# Patient Record
Sex: Female | Born: 1950 | Race: Black or African American | Hispanic: No | Smoking: Never smoker
Health system: Southern US, Community
[De-identification: ages and names within clinical notes are randomized; demographics above are authoritative.]

## PROBLEM LIST (undated history)

## (undated) DIAGNOSIS — E119 Type 2 diabetes mellitus without complications: Secondary | ICD-10-CM

## (undated) DIAGNOSIS — L309 Dermatitis, unspecified: Secondary | ICD-10-CM

## (undated) DIAGNOSIS — M549 Dorsalgia, unspecified: Secondary | ICD-10-CM

## (undated) DIAGNOSIS — F419 Anxiety disorder, unspecified: Secondary | ICD-10-CM

## (undated) DIAGNOSIS — E785 Hyperlipidemia, unspecified: Secondary | ICD-10-CM

## (undated) DIAGNOSIS — R531 Weakness: Secondary | ICD-10-CM

## (undated) DIAGNOSIS — R519 Headache, unspecified: Secondary | ICD-10-CM

## (undated) DIAGNOSIS — M1990 Unspecified osteoarthritis, unspecified site: Secondary | ICD-10-CM

## (undated) DIAGNOSIS — I10 Essential (primary) hypertension: Secondary | ICD-10-CM

## (undated) DIAGNOSIS — R51 Headache: Secondary | ICD-10-CM

## (undated) DIAGNOSIS — G8929 Other chronic pain: Secondary | ICD-10-CM

## (undated) HISTORY — PX: BACK SURGERY: SHX140

## (undated) HISTORY — PX: JOINT REPLACEMENT: SHX530

## (undated) HISTORY — PX: ABDOMINAL HYSTERECTOMY: SHX81

## (undated) HISTORY — PX: SPINAL FUSION: SHX223

---

## 2000-12-02 ENCOUNTER — Ambulatory Visit (HOSPITAL_COMMUNITY): Admission: RE | Admit: 2000-12-02 | Discharge: 2000-12-02 | Payer: Self-pay | Admitting: Obstetrics and Gynecology

## 2005-08-11 ENCOUNTER — Inpatient Hospital Stay: Admission: AD | Admit: 2005-08-11 | Discharge: 2005-08-14 | Payer: Self-pay | Admitting: Family Medicine

## 2005-08-12 ENCOUNTER — Ambulatory Visit: Payer: Self-pay | Admitting: Family Medicine

## 2007-03-31 ENCOUNTER — Encounter: Payer: Self-pay | Admitting: Family Medicine

## 2010-05-01 NOTE — Letter (Signed)
Summary: rma chart  rma chart   Imported By: Curtis Sites 11/15/2009 11:19:11  _____________________________________________________________________  External Attachment:    Type:   Image     Comment:   External Document

## 2014-05-15 ENCOUNTER — Other Ambulatory Visit: Payer: Self-pay | Admitting: Neurosurgery

## 2014-05-15 DIAGNOSIS — M5416 Radiculopathy, lumbar region: Secondary | ICD-10-CM

## 2014-05-25 ENCOUNTER — Ambulatory Visit
Admission: RE | Admit: 2014-05-25 | Discharge: 2014-05-25 | Disposition: A | Discharge status: Home / Self Care | Payer: Self-pay | Source: Ambulatory Visit | Attending: Neurosurgery | Admitting: Neurosurgery

## 2014-05-25 ENCOUNTER — Ambulatory Visit
Admission: RE | Admit: 2014-05-25 | Discharge: 2014-05-25 | Disposition: A | Discharge status: Home / Self Care | Payer: Worker's Compensation | Source: Ambulatory Visit | Attending: Neurosurgery | Admitting: Neurosurgery

## 2014-05-25 DIAGNOSIS — M5416 Radiculopathy, lumbar region: Secondary | ICD-10-CM

## 2014-05-25 MED ORDER — MEPERIDINE HCL 100 MG/ML IJ SOLN
50.0000 mg | Freq: Once | INTRAMUSCULAR | Status: AC
  Administered 2014-05-25: 50 mg via INTRAMUSCULAR

## 2014-05-25 MED ORDER — DIAZEPAM 5 MG PO TABS
10.0000 mg | ORAL_TABLET | Freq: Once | ORAL | Status: AC
  Administered 2014-05-25: 10 mg via ORAL

## 2014-05-25 MED ORDER — ONDANSETRON HCL 4 MG/2ML IJ SOLN
4.0000 mg | Freq: Once | INTRAMUSCULAR | Status: AC
  Administered 2014-05-25: 4 mg via INTRAMUSCULAR

## 2014-05-25 MED ORDER — IOHEXOL 180 MG/ML  SOLN
17.0000 mL | Freq: Once | INTRAMUSCULAR | Status: AC | PRN
  Administered 2014-05-25: 17 mL via INTRATHECAL

## 2014-05-25 NOTE — Discharge Instructions (Signed)
Myelogram Discharge Instructions  1. Go home and rest quietly for the next 24 hours.  It is important to lie flat for the next 24 hours.  Get up only to go to the restroom.  You may lie in the bed or on a couch on your back, your stomach, your left side or your right side.  You may have one pillow under your head.  You may have pillows between your knees while you are on your side or under your knees while you are on your back.  2. DO NOT drive today.  Recline the seat as far back as it will go, while still wearing your seat belt, on the way home.  3. You may get up to go to the bathroom as needed.  You may sit up for 10 minutes to eat.  You may resume your normal diet and medications unless otherwise indicated.  Drink plenty of extra fluids today and tomorrow.  4. The incidence of a spinal headache with nausea and/or vomiting is about 5% (one in 20 patients).  If you develop a headache, lie flat and drink plenty of fluids until the headache goes away.  Caffeinated beverages may be helpful.  If you develop severe nausea and vomiting or a headache that does not go away with flat bed rest, call [phone].  5. You may resume normal activities after your 24 hours of bed rest is over; however, do not exert yourself strongly or do any heavy lifting tomorrow.  6. Call your physician for a follow-up appointment.   You may resume Tramadol on Saturday, May 26, 2014 after 1:00pm.

## 2014-05-25 NOTE — Progress Notes (Signed)
Patient states she has been off Tramadol for at least the past two days.  jkl 

## 2015-04-30 ENCOUNTER — Other Ambulatory Visit: Payer: Self-pay | Admitting: Physician Assistant

## 2015-04-30 DIAGNOSIS — Z1231 Encounter for screening mammogram for malignant neoplasm of breast: Secondary | ICD-10-CM

## 2015-05-14 ENCOUNTER — Ambulatory Visit
Admission: RE | Admit: 2015-05-14 | Discharge: 2015-05-14 | Disposition: A | Discharge status: Home / Self Care | Payer: BLUE CROSS/BLUE SHIELD | Source: Ambulatory Visit | Attending: Physician Assistant | Admitting: Physician Assistant

## 2015-05-14 DIAGNOSIS — Z1231 Encounter for screening mammogram for malignant neoplasm of breast: Secondary | ICD-10-CM

## 2015-11-07 ENCOUNTER — Other Ambulatory Visit: Payer: Self-pay | Admitting: Neurological Surgery

## 2015-11-19 ENCOUNTER — Encounter (HOSPITAL_COMMUNITY)
Admission: RE | Admit: 2015-11-19 | Discharge: 2015-11-19 | Disposition: A | Discharge status: Home / Self Care | Payer: Worker's Compensation | Source: Ambulatory Visit | Attending: Neurological Surgery | Admitting: Neurological Surgery

## 2015-11-19 ENCOUNTER — Encounter (HOSPITAL_COMMUNITY): Payer: Self-pay

## 2015-11-19 ENCOUNTER — Other Ambulatory Visit: Payer: Self-pay

## 2015-11-19 DIAGNOSIS — Z01818 Encounter for other preprocedural examination: Secondary | ICD-10-CM | POA: Insufficient documentation

## 2015-11-19 DIAGNOSIS — Z96643 Presence of artificial hip joint, bilateral: Secondary | ICD-10-CM | POA: Insufficient documentation

## 2015-11-19 DIAGNOSIS — M4803 Spinal stenosis, cervicothoracic region: Secondary | ICD-10-CM | POA: Diagnosis not present

## 2015-11-19 DIAGNOSIS — G8929 Other chronic pain: Secondary | ICD-10-CM | POA: Diagnosis not present

## 2015-11-19 DIAGNOSIS — I10 Essential (primary) hypertension: Secondary | ICD-10-CM | POA: Diagnosis not present

## 2015-11-19 DIAGNOSIS — Z01812 Encounter for preprocedural laboratory examination: Secondary | ICD-10-CM | POA: Insufficient documentation

## 2015-11-19 DIAGNOSIS — E119 Type 2 diabetes mellitus without complications: Secondary | ICD-10-CM | POA: Insufficient documentation

## 2015-11-19 DIAGNOSIS — Z0183 Encounter for blood typing: Secondary | ICD-10-CM | POA: Insufficient documentation

## 2015-11-19 DIAGNOSIS — M5416 Radiculopathy, lumbar region: Secondary | ICD-10-CM | POA: Diagnosis not present

## 2015-11-19 DIAGNOSIS — Z79899 Other long term (current) drug therapy: Secondary | ICD-10-CM | POA: Insufficient documentation

## 2015-11-19 DIAGNOSIS — E785 Hyperlipidemia, unspecified: Secondary | ICD-10-CM | POA: Diagnosis not present

## 2015-11-19 DIAGNOSIS — M4316 Spondylolisthesis, lumbar region: Secondary | ICD-10-CM | POA: Insufficient documentation

## 2015-11-19 DIAGNOSIS — Z7984 Long term (current) use of oral hypoglycemic drugs: Secondary | ICD-10-CM | POA: Diagnosis not present

## 2015-11-19 HISTORY — DX: Headache: R51

## 2015-11-19 HISTORY — DX: Hyperlipidemia, unspecified: E78.5

## 2015-11-19 HISTORY — DX: Dermatitis, unspecified: L30.9

## 2015-11-19 HISTORY — DX: Essential (primary) hypertension: I10

## 2015-11-19 HISTORY — DX: Weakness: R53.1

## 2015-11-19 HISTORY — DX: Headache, unspecified: R51.9

## 2015-11-19 HISTORY — DX: Dorsalgia, unspecified: M54.9

## 2015-11-19 HISTORY — DX: Type 2 diabetes mellitus without complications: E11.9

## 2015-11-19 HISTORY — DX: Other chronic pain: G89.29

## 2015-11-19 LAB — BASIC METABOLIC PANEL
ANION GAP: 6 (ref 5–15)
BUN: 8 mg/dL (ref 6–20)
CALCIUM: 9.5 mg/dL (ref 8.9–10.3)
CO2: 27 mmol/L (ref 22–32)
CREATININE: 0.92 mg/dL (ref 0.44–1.00)
Chloride: 105 mmol/L (ref 101–111)
GFR calc Af Amer: >60 mL/min (ref >60)
GLUCOSE: 90 mg/dL (ref 65–99)
Potassium: 3.8 mmol/L (ref 3.5–5.1)
Sodium: 138 mmol/L (ref 135–145)

## 2015-11-19 LAB — CBC
HCT: 44 % (ref 36.0–46.0)
HEMOGLOBIN: 14.2 g/dL (ref 12.0–15.0)
MCH: 29.8 pg (ref 26.0–34.0)
MCHC: 32.3 g/dL (ref 30.0–36.0)
MCV: 92.2 fL (ref 78.0–100.0)
PLATELETS: 284 10*3/uL (ref 150–400)
RBC: 4.77 MIL/uL (ref 3.87–5.11)
RDW: 13.2 % (ref 11.5–15.5)
WBC: 5.1 10*3/uL (ref 4.0–10.5)

## 2015-11-19 LAB — GLUCOSE, CAPILLARY: Glucose-Capillary: 83 mg/dL (ref 65–99)

## 2015-11-19 LAB — SURGICAL PCR SCREEN
MRSA, PCR: NEGATIVE
STAPHYLOCOCCUS AUREUS: NEGATIVE

## 2015-11-19 LAB — ABO/RH: ABO/RH(D): A POS

## 2015-11-19 MED ORDER — CHLORHEXIDINE GLUCONATE CLOTH 2 % EX PADS: 6.0000 | MEDICATED_PAD | Freq: Once | CUTANEOUS | Status: DC

## 2015-11-19 NOTE — Progress Notes (Addendum)
Cardiologist saw a cardiologist a few yrs ago,is supposed to follow up yearly she states.Dr.Zakhary in [place]-to request any records  [facility] [place]enny [place]aucom [place]  Echo unsure  Stress test done 6+ yrs ago  Heart cath denies  EKG denies in past yr  CXR denies in past yr

## 2015-11-19 NOTE — Pre-Procedure Instructions (Signed)
Kathleen Bishop  11/19/2015      [facility] - [place], [place] - [address] [place] [address] Phone: [phone] Fax: [phone]  [facility] - [place], [place] - [id number] [place] [address] Phone: [phone] Fax: [phone]  [facility] - [place], [place] - [address] [place] [address] Phone: [phone] Fax: [phone]    Your procedure is scheduled on Tues, Aug 29  @ 7:30 AM  [facility] at 5:30 AM  Call this number if you have problems the morning of surgery:  [phone]   Remember:  Do not eat food or drink liquids after midnight.             Stop taking your Ibuprofen. No Goody's,BC's,Aleve,Aspirin,Advil,Motrin,Fish Oil,or any Herbal Medications a week prior to surgery.      How to Manage Your Diabetes Before and After Surgery  Why is it important to control my blood sugar before and after surgery? . Improving blood sugar levels before and after surgery helps healing and can limit problems. . A way of improving blood sugar control is eating a healthy diet by: o  Eating less sugar and carbohydrates o  Increasing activity/exercise o  Talking with your doctor about reaching your blood sugar goals . High blood sugars (greater than 180 mg/dL) can raise your risk of infections and slow your recovery, so you will need to focus on controlling your diabetes during the weeks before surgery. . Make sure that the doctor who takes care of your diabetes knows about your planned surgery including the date and location.  How do I manage my blood sugar before surgery? . Check your blood sugar at least 4 times a day, starting 2 days before surgery, to make sure that the level is not too high or low. o Check your blood sugar the morning of your surgery when you wake up and every 2 hours until you get to the Short Stay  unit. . If your blood sugar is less than 70 mg/dL, you will need to treat for low blood sugar: o Do not take insulin. o Treat a low blood sugar (less than 70 mg/dL) with  cup of clear juice (cranberry or apple), 4 glucose tablets, OR glucose gel. o Recheck blood sugar in 15 minutes after treatment (to make sure it is greater than 70 mg/dL). If your blood sugar is not greater than 70 mg/dL on recheck, call [phone] for further instructions. . Report your blood sugar to the short stay nurse when you get to Short Stay.  . If you are admitted to the hospital after surgery: o Your blood sugar will be checked by the staff and you will probably be given insulin after surgery (instead of oral diabetes medicines) to make sure you have good blood sugar levels. o The goal for blood sugar control after surgery is 80-180 mg/dL.              WHAT DO I DO ABOUT MY DIABETES MEDICATION?   Marland Kitchen. Do not take oral diabetes medicines (pills) the morning of surgery.       . The day of surgery, do not take other diabetes injectables, including Byetta (exenatide), Bydureon (exenatide ER), Victoza (liraglutide), or Trulicity (dulaglutide).  . If your CBG is greater than 220 mg/dL, you may take  of your sliding scale (correction) dose of insulin.  Other Instructions:  Patient Signature:  Date:   Nurse Signature:  Date:   Reviewed and Endorsed by [facility] Patient Education Committee, August 2015   Do not wear jewelry, make-up or nail polish.  Do not wear lotions, powders, or perfumes.    Do not shave 48 hours prior to surgery.     Do not bring valuables to the hospital.  [facility] is not responsible for any belongings or valuables.  Contacts, dentures or bridgework may not be worn into surgery.  Leave your suitcase in the car.  After surgery it may be brought to your room.  For patients admitted to the hospital, discharge time will be determined by your treatment  team.  Patients discharged the day of surgery will not be allowed to drive home.    [facility] Pre[facility]  Before surgery, you can play an important role.  Because skin is not sterile, your skin needs to be as free of germs as possible.  You can reduce the number of germs on you skin by washing with CHG (chlorahexidine gluconate) soap before surgery.  CHG is an antiseptic cleaner which kills germs and bonds with the skin to continue killing germs even after washing.  Please DO NOT use if you have an allergy to CHG or antibacterial soaps.  If your skin becomes reddened/irritated stop using the CHG and inform your nurse when you arrive at Short Stay.  Do not shave (including legs and underarms) for at least 48 hours prior to the first CHG shower.  You may shave your face.  Please follow these instructions carefully:   1.  Shower with CHG Soap the night before surgery and the                                morning of Surgery.  2.  If you choose to wash your hair, wash your hair first as usual with your       normal shampoo.  3.  After you shampoo, rinse your hair and body thoroughly to remove the                      Shampoo.  4.  Use CHG as you would any other liquid soap.  You can apply chg directly       to the skin and wash gently with scrungie or a clean washcloth.  5.  Apply the CHG Soap to your body ONLY FROM THE NECK DOWN.        Do not use on open wounds or open sores.  Avoid contact with your eyes,       ears, mouth and genitals (private parts).  Wash genitals (private parts)       with your normal soap.  6.  Wash thoroughly, paying special attention to the area where your surgery        will be performed.  7.  Thoroughly rinse your body with warm water from the neck down.  8.  DO NOT shower/wash with your normal soap after using and rinsing off       the CHG Soap.  9.  Pat yourself dry with a clean towel.            10.  Wear clean pajamas.             11.  Place clean sheets on your bed the night of your first  shower and do not        sleep with pets.  Day of Surgery  Do not apply any lotions/deoderants the morning of surgery.  Please wear clean clothes to the hospital/surgery center.     Please read over the following fact sheets that you were given. Pain Booklet, Coughing and Deep Breathing, MRSA Information and Surgical Site Infection Prevention

## 2015-11-20 LAB — HEMOGLOBIN A1C
HEMOGLOBIN A1C: 6.9 % — AB (ref 4.8–5.6)
MEAN PLASMA GLUCOSE: 151 mg/dL

## 2015-11-20 NOTE — Progress Notes (Signed)
Anesthesia Chart Review: Patient is a 65 year old female scheduled for L2-3, L3-4, L4-5 PLIF on 11/26/15 by Dr. Danielle DessElsner.  History includes non-smoker, HTN, HLD, chronic back pain, DM2, hysterectomy, bilateral THA. BMI is consistent with morbid obesity.  PCP is Emilio MathJenny Baucom, PA-C with [facility] in [place]. She has been seen by cardiologist Dr. Rockne Meng[place]akhary in [place] in the past, last visit 02/26/14 for follow-up of CAD risk factors, but without known diagnosis of CAD. No new testing ordered at that time. Non-ischemic stress echo at achieved workload in 2012.   Meds includes lisinopril, metformin, simvastatin.  BP (!) 174/88   Pulse 74   Temp 37.2 C   Resp 20   Ht 5\' 5"  (1.651 m)   Wt 248 lb (112.5 kg)   SpO2 100%   BMI 41.27 kg/m    11/19/15 EKG: NSR with sinus arrhythmia.  06/16/10 Exercise Stress Echocardiogram (Dr. Rockne MenghiniZakhary): Conclusion: Normal stress test with no evidence of myocardial ischemia at workload achieved. This is a submaximal exercise stress test (exercised 4:10, peak HR 74% of age predicted HR, 6 METS). LVEF pre-exercise 60%. Recommendations: Continue medical therapy.  Preoperative labs noted. BMET, CBC WNL. A1c 6.9. T&S done.    PAT results acceptable for OR. If no acute changes then I would anticipate that she can proceed as planned. Discussed with anesthesiologist Dr. Desmond Lopeurk.  Velna Ochsllison Keyontay Stolz, PA-C [facility] Short Stay Center/Anesthesiology Phone [phone] 11/20/2015 4:32 PM

## 2015-11-25 MED ORDER — CEFAZOLIN SODIUM-DEXTROSE 2-4 GM/100ML-% IV SOLN
2.0000 g | INTRAVENOUS | Status: AC
  Administered 2015-11-26 (×2): 2 g via INTRAVENOUS
  Filled 2015-11-25: qty 100

## 2015-11-26 ENCOUNTER — Inpatient Hospital Stay (HOSPITAL_COMMUNITY): Payer: Worker's Compensation

## 2015-11-26 ENCOUNTER — Inpatient Hospital Stay (HOSPITAL_COMMUNITY): Payer: Worker's Compensation | Admitting: Anesthesiology

## 2015-11-26 ENCOUNTER — Inpatient Hospital Stay (HOSPITAL_COMMUNITY)
Admission: RE | Admit: 2015-11-26 | Discharge: 2015-12-04 | DRG: 460 | Disposition: A | Discharge status: Skilled Nursing Facility | Payer: Worker's Compensation | Source: Ambulatory Visit | Attending: Neurological Surgery | Admitting: Neurological Surgery

## 2015-11-26 ENCOUNTER — Inpatient Hospital Stay (HOSPITAL_COMMUNITY)
Admission: RE | Disposition: A | Discharge status: Skilled Nursing Facility | Payer: Self-pay | Source: Ambulatory Visit | Attending: Neurological Surgery

## 2015-11-26 ENCOUNTER — Inpatient Hospital Stay (HOSPITAL_COMMUNITY): Payer: Worker's Compensation | Admitting: Vascular Surgery

## 2015-11-26 DIAGNOSIS — R319 Hematuria, unspecified: Secondary | ICD-10-CM | POA: Diagnosis not present

## 2015-11-26 DIAGNOSIS — E876 Hypokalemia: Secondary | ICD-10-CM | POA: Diagnosis present

## 2015-11-26 DIAGNOSIS — M5416 Radiculopathy, lumbar region: Secondary | ICD-10-CM | POA: Diagnosis present

## 2015-11-26 DIAGNOSIS — Z96643 Presence of artificial hip joint, bilateral: Secondary | ICD-10-CM | POA: Diagnosis present

## 2015-11-26 DIAGNOSIS — Z79899 Other long term (current) drug therapy: Secondary | ICD-10-CM

## 2015-11-26 DIAGNOSIS — E119 Type 2 diabetes mellitus without complications: Secondary | ICD-10-CM | POA: Diagnosis present

## 2015-11-26 DIAGNOSIS — I9581 Postprocedural hypotension: Secondary | ICD-10-CM | POA: Diagnosis not present

## 2015-11-26 DIAGNOSIS — D6489 Other specified anemias: Secondary | ICD-10-CM | POA: Diagnosis present

## 2015-11-26 DIAGNOSIS — G8929 Other chronic pain: Secondary | ICD-10-CM | POA: Diagnosis present

## 2015-11-26 DIAGNOSIS — M4316 Spondylolisthesis, lumbar region: Secondary | ICD-10-CM | POA: Diagnosis present

## 2015-11-26 DIAGNOSIS — G8918 Other acute postprocedural pain: Secondary | ICD-10-CM

## 2015-11-26 DIAGNOSIS — Z6841 Body Mass Index (BMI) 40.0 and over, adult: Secondary | ICD-10-CM

## 2015-11-26 DIAGNOSIS — D62 Acute posthemorrhagic anemia: Secondary | ICD-10-CM | POA: Diagnosis not present

## 2015-11-26 DIAGNOSIS — R34 Anuria and oliguria: Secondary | ICD-10-CM | POA: Diagnosis not present

## 2015-11-26 DIAGNOSIS — I10 Essential (primary) hypertension: Secondary | ICD-10-CM | POA: Diagnosis present

## 2015-11-26 DIAGNOSIS — R6510 Systemic inflammatory response syndrome (SIRS) of non-infectious origin without acute organ dysfunction: Secondary | ICD-10-CM | POA: Diagnosis not present

## 2015-11-26 DIAGNOSIS — Z7984 Long term (current) use of oral hypoglycemic drugs: Secondary | ICD-10-CM | POA: Diagnosis not present

## 2015-11-26 DIAGNOSIS — M479 Spondylosis, unspecified: Secondary | ICD-10-CM | POA: Diagnosis present

## 2015-11-26 DIAGNOSIS — R079 Chest pain, unspecified: Secondary | ICD-10-CM | POA: Diagnosis not present

## 2015-11-26 DIAGNOSIS — R509 Fever, unspecified: Secondary | ICD-10-CM | POA: Diagnosis not present

## 2015-11-26 DIAGNOSIS — M4806 Spinal stenosis, lumbar region: Secondary | ICD-10-CM | POA: Diagnosis present

## 2015-11-26 DIAGNOSIS — E785 Hyperlipidemia, unspecified: Secondary | ICD-10-CM | POA: Diagnosis present

## 2015-11-26 DIAGNOSIS — Z419 Encounter for procedure for purposes other than remedying health state, unspecified: Secondary | ICD-10-CM

## 2015-11-26 DIAGNOSIS — R21 Rash and other nonspecific skin eruption: Secondary | ICD-10-CM | POA: Diagnosis not present

## 2015-11-26 DIAGNOSIS — N179 Acute kidney failure, unspecified: Secondary | ICD-10-CM | POA: Diagnosis not present

## 2015-11-26 DIAGNOSIS — E118 Type 2 diabetes mellitus with unspecified complications: Secondary | ICD-10-CM | POA: Diagnosis not present

## 2015-11-26 DIAGNOSIS — M545 Low back pain: Secondary | ICD-10-CM | POA: Diagnosis present

## 2015-11-26 DIAGNOSIS — M48061 Spinal stenosis, lumbar region without neurogenic claudication: Secondary | ICD-10-CM | POA: Diagnosis present

## 2015-11-26 LAB — GLUCOSE, CAPILLARY
Glucose-Capillary: 123 mg/dL — ABNORMAL HIGH (ref 65–99)
Glucose-Capillary: 193 mg/dL — ABNORMAL HIGH (ref 65–99)
Glucose-Capillary: 178 mg/dL — ABNORMAL HIGH (ref 65–99)

## 2015-11-26 SURGERY — POSTERIOR LUMBAR FUSION 3 LEVEL
Anesthesia: General | Site: Spine Lumbar

## 2015-11-26 MED ORDER — ROCURONIUM BROMIDE 100 MG/10ML IV SOLN
INTRAVENOUS | Status: DC | PRN
  Administered 2015-11-26: 60 mg via INTRAVENOUS
  Administered 2015-11-26: 30 mg via INTRAVENOUS
  Administered 2015-11-26: 40 mg via INTRAVENOUS
  Administered 2015-11-26 (×2): 20 mg via INTRAVENOUS
  Administered 2015-11-26: 50 mg via INTRAVENOUS
  Administered 2015-11-26: 10 mg via INTRAVENOUS

## 2015-11-26 MED ORDER — OXYCODONE-ACETAMINOPHEN 5-325 MG PO TABS
1.0000 | ORAL_TABLET | ORAL | Status: DC | PRN
  Administered 2015-11-26 – 2015-11-27 (×2): 2 via ORAL
  Filled 2015-11-26 (×2): qty 2

## 2015-11-26 MED ORDER — CEFAZOLIN SODIUM 1 G IJ SOLR
INTRAMUSCULAR | Status: AC
  Filled 2015-11-26: qty 20

## 2015-11-26 MED ORDER — PNEUMOCOCCAL VAC POLYVALENT 25 MCG/0.5ML IJ INJ: 0.5000 mL | INJECTION | INTRAMUSCULAR | Status: DC | PRN

## 2015-11-26 MED ORDER — SIMVASTATIN 40 MG PO TABS
40.0000 mg | ORAL_TABLET | Freq: Every day | ORAL | Status: DC
  Administered 2015-11-26: 40 mg via ORAL
  Filled 2015-11-26: qty 1

## 2015-11-26 MED ORDER — DIPHENHYDRAMINE HCL 50 MG/ML IJ SOLN
INTRAMUSCULAR | Status: DC | PRN
  Administered 2015-11-26: 25 mg via INTRAVENOUS

## 2015-11-26 MED ORDER — MORPHINE SULFATE (PF) 2 MG/ML IV SOLN
1.0000 mg | INTRAVENOUS | Status: DC | PRN
  Administered 2015-11-26: 4 mg via INTRAVENOUS
  Administered 2015-11-26 (×2): 2 mg via INTRAVENOUS
  Administered 2015-11-27: 4 mg via INTRAVENOUS
  Filled 2015-11-26 (×2): qty 2
  Filled 2015-11-26: qty 1

## 2015-11-26 MED ORDER — THROMBIN 20000 UNITS EX SOLR
CUTANEOUS | Status: DC | PRN
  Administered 2015-11-26: 20 mL via TOPICAL

## 2015-11-26 MED ORDER — OXYCODONE HCL 5 MG/5ML PO SOLN: 5.0000 mg | Freq: Once | ORAL | Status: DC | PRN

## 2015-11-26 MED ORDER — SENNA 8.6 MG PO TABS
1.0000 | ORAL_TABLET | Freq: Two times a day (BID) | ORAL | Status: DC
  Administered 2015-11-26 – 2015-12-04 (×16): 8.6 mg via ORAL
  Filled 2015-11-26 (×16): qty 1

## 2015-11-26 MED ORDER — LACTATED RINGERS IV SOLN
INTRAVENOUS | Status: DC | PRN
  Administered 2015-11-26 (×3): via INTRAVENOUS

## 2015-11-26 MED ORDER — METFORMIN HCL 500 MG PO TABS
500.0000 mg | ORAL_TABLET | Freq: Two times a day (BID) | ORAL | Status: DC
  Administered 2015-11-26: 500 mg via ORAL
  Filled 2015-11-26: qty 1

## 2015-11-26 MED ORDER — ALBUMIN HUMAN 5 % IV SOLN
INTRAVENOUS | Status: DC | PRN
  Administered 2015-11-26 (×3): via INTRAVENOUS

## 2015-11-26 MED ORDER — EPHEDRINE SULFATE 50 MG/ML IJ SOLN
INTRAMUSCULAR | Status: DC | PRN
  Administered 2015-11-26: 10 mg via INTRAVENOUS

## 2015-11-26 MED ORDER — HYDROMORPHONE HCL 1 MG/ML IJ SOLN
0.2500 mg | INTRAMUSCULAR | Status: DC | PRN
  Administered 2015-11-26: 0.5 mg via INTRAVENOUS

## 2015-11-26 MED ORDER — MORPHINE SULFATE (PF) 2 MG/ML IV SOLN
INTRAVENOUS | Status: AC
  Filled 2015-11-26: qty 1

## 2015-11-26 MED ORDER — PROPOFOL 10 MG/ML IV BOLUS
INTRAVENOUS | Status: AC
  Filled 2015-11-26: qty 20

## 2015-11-26 MED ORDER — SUGAMMADEX SODIUM 500 MG/5ML IV SOLN
INTRAVENOUS | Status: DC | PRN
  Administered 2015-11-26: 250 mg via INTRAVENOUS

## 2015-11-26 MED ORDER — ONDANSETRON HCL 4 MG/2ML IJ SOLN
4.0000 mg | INTRAMUSCULAR | Status: DC | PRN
  Administered 2015-11-26 – 2015-11-27 (×2): 4 mg via INTRAVENOUS
  Filled 2015-11-26 (×2): qty 2

## 2015-11-26 MED ORDER — EPHEDRINE 5 MG/ML INJ
INTRAVENOUS | Status: AC
  Filled 2015-11-26: qty 10

## 2015-11-26 MED ORDER — OXYCODONE HCL 5 MG PO TABS: 5.0000 mg | ORAL_TABLET | Freq: Once | ORAL | Status: DC | PRN

## 2015-11-26 MED ORDER — FENTANYL CITRATE (PF) 100 MCG/2ML IJ SOLN
INTRAMUSCULAR | Status: DC | PRN
  Administered 2015-11-26 (×8): 50 ug via INTRAVENOUS

## 2015-11-26 MED ORDER — ACETAMINOPHEN 325 MG PO TABS
650.0000 mg | ORAL_TABLET | ORAL | Status: DC | PRN
  Administered 2015-11-28 (×2): 650 mg via ORAL
  Filled 2015-11-26 (×2): qty 2

## 2015-11-26 MED ORDER — PHENOL 1.4 % MT LIQD: 1.0000 | OROMUCOSAL | Status: DC | PRN

## 2015-11-26 MED ORDER — LIDOCAINE 2% (20 MG/ML) 5 ML SYRINGE
INTRAMUSCULAR | Status: AC
  Filled 2015-11-26: qty 5

## 2015-11-26 MED ORDER — SODIUM CHLORIDE 0.9 % IV SOLN
INTRAVENOUS | Status: DC
  Administered 2015-11-26: 19:00:00 via INTRAVENOUS

## 2015-11-26 MED ORDER — LISINOPRIL 20 MG PO TABS
40.0000 mg | ORAL_TABLET | Freq: Every day | ORAL | Status: DC
  Administered 2015-11-27: 40 mg via ORAL
  Filled 2015-11-26: qty 2

## 2015-11-26 MED ORDER — ROCURONIUM BROMIDE 10 MG/ML (PF) SYRINGE
PREFILLED_SYRINGE | INTRAVENOUS | Status: AC
  Filled 2015-11-26: qty 10

## 2015-11-26 MED ORDER — PROPOFOL 10 MG/ML IV BOLUS
INTRAVENOUS | Status: DC | PRN
  Administered 2015-11-26: 20 mg via INTRAVENOUS
  Administered 2015-11-26: 180 mg via INTRAVENOUS

## 2015-11-26 MED ORDER — DOCUSATE SODIUM 100 MG PO CAPS
100.0000 mg | ORAL_CAPSULE | Freq: Two times a day (BID) | ORAL | Status: DC
  Administered 2015-11-26 – 2015-12-04 (×16): 100 mg via ORAL
  Filled 2015-11-26 (×16): qty 1

## 2015-11-26 MED ORDER — PHENYLEPHRINE 40 MCG/ML (10ML) SYRINGE FOR IV PUSH (FOR BLOOD PRESSURE SUPPORT)
PREFILLED_SYRINGE | INTRAVENOUS | Status: AC
  Filled 2015-11-26: qty 10

## 2015-11-26 MED ORDER — POLYETHYLENE GLYCOL 3350 17 G PO PACK
17.0000 g | PACK | Freq: Every day | ORAL | Status: DC | PRN
  Administered 2015-12-01 (×2): 17 g via ORAL
  Filled 2015-11-26 (×2): qty 1

## 2015-11-26 MED ORDER — HYDROMORPHONE HCL 1 MG/ML IJ SOLN
INTRAMUSCULAR | Status: AC
  Filled 2015-11-26: qty 1

## 2015-11-26 MED ORDER — PHENYLEPHRINE HCL 10 MG/ML IJ SOLN
INTRAMUSCULAR | Status: AC
  Filled 2015-11-26: qty 4

## 2015-11-26 MED ORDER — BUPIVACAINE HCL (PF) 0.5 % IJ SOLN
INTRAMUSCULAR | Status: DC | PRN
  Administered 2015-11-26: 23 mL
  Administered 2015-11-26: 5 mL

## 2015-11-26 MED ORDER — ACETAMINOPHEN 650 MG RE SUPP: 650.0000 mg | RECTAL | Status: DC | PRN

## 2015-11-26 MED ORDER — FENTANYL CITRATE (PF) 100 MCG/2ML IJ SOLN
INTRAMUSCULAR | Status: AC
  Filled 2015-11-26: qty 4

## 2015-11-26 MED ORDER — LIDOCAINE HCL (CARDIAC) 20 MG/ML IV SOLN
INTRAVENOUS | Status: DC | PRN
  Administered 2015-11-26: 40 mg via INTRAVENOUS

## 2015-11-26 MED ORDER — THROMBIN 5000 UNITS EX SOLR
OROMUCOSAL | Status: DC | PRN
  Administered 2015-11-26 (×2): 5 mL via TOPICAL

## 2015-11-26 MED ORDER — DIAZEPAM 5 MG PO TABS
5.0000 mg | ORAL_TABLET | Freq: Four times a day (QID) | ORAL | Status: DC | PRN
  Administered 2015-11-27 – 2015-12-04 (×8): 5 mg via ORAL
  Filled 2015-11-26 (×8): qty 1

## 2015-11-26 MED ORDER — 0.9 % SODIUM CHLORIDE (POUR BTL) OPTIME
TOPICAL | Status: DC | PRN
  Administered 2015-11-26: 1000 mL

## 2015-11-26 MED ORDER — LIDOCAINE-EPINEPHRINE 1 %-1:100000 IJ SOLN
INTRAMUSCULAR | Status: DC | PRN
  Administered 2015-11-26: 5 mL

## 2015-11-26 MED ORDER — DEXTROSE 5 % IV SOLN
INTRAVENOUS | Status: DC | PRN
  Administered 2015-11-26: 120 ug/min via INTRAVENOUS
  Administered 2015-11-26 (×2): via INTRAVENOUS
  Administered 2015-11-26: 30 ug/min via INTRAVENOUS

## 2015-11-26 MED ORDER — ONDANSETRON HCL 4 MG/2ML IJ SOLN
INTRAMUSCULAR | Status: DC | PRN
  Administered 2015-11-26: 4 mg via INTRAVENOUS

## 2015-11-26 MED ORDER — ONDANSETRON HCL 4 MG/2ML IJ SOLN: 4.0000 mg | Freq: Once | INTRAMUSCULAR | Status: DC | PRN

## 2015-11-26 MED ORDER — KETOROLAC TROMETHAMINE 15 MG/ML IJ SOLN
15.0000 mg | Freq: Four times a day (QID) | INTRAMUSCULAR | Status: DC
Start: 2015-11-26 — End: 2015-11-27
  Administered 2015-11-26 – 2015-11-27 (×3): 15 mg via INTRAVENOUS
  Filled 2015-11-26 (×3): qty 1

## 2015-11-26 MED ORDER — SODIUM CHLORIDE 0.9 % IV SOLN
250.0000 mL | INTRAVENOUS | Status: DC
  Administered 2015-11-29: 250 mL via INTRAVENOUS

## 2015-11-26 MED ORDER — SODIUM CHLORIDE 0.9% FLUSH
3.0000 mL | Freq: Two times a day (BID) | INTRAVENOUS | Status: DC
  Administered 2015-11-27: 10 mL via INTRAVENOUS
  Administered 2015-11-28: 3 mL via INTRAVENOUS

## 2015-11-26 MED ORDER — MENTHOL 3 MG MT LOZG: 1.0000 | LOZENGE | OROMUCOSAL | Status: DC | PRN

## 2015-11-26 MED ORDER — SODIUM CHLORIDE 0.9% FLUSH: 3.0000 mL | INTRAVENOUS | Status: DC | PRN

## 2015-11-26 MED ORDER — MIDAZOLAM HCL 2 MG/2ML IJ SOLN
INTRAMUSCULAR | Status: AC
  Filled 2015-11-26: qty 2

## 2015-11-26 MED ORDER — SODIUM CHLORIDE 0.9 % IR SOLN
Status: DC | PRN
  Administered 2015-11-26: 500 mL

## 2015-11-26 MED ORDER — ALUM & MAG HYDROXIDE-SIMETH 200-200-20 MG/5ML PO SUSP: 30.0000 mL | Freq: Four times a day (QID) | ORAL | Status: DC | PRN

## 2015-11-26 MED ORDER — PHENYLEPHRINE HCL 10 MG/ML IJ SOLN
INTRAMUSCULAR | Status: DC | PRN
  Administered 2015-11-26 (×2): 120 ug via INTRAVENOUS
  Administered 2015-11-26: 80 ug via INTRAVENOUS
  Administered 2015-11-26: 120 ug via INTRAVENOUS
  Administered 2015-11-26: 40 ug via INTRAVENOUS
  Administered 2015-11-26: 120 ug via INTRAVENOUS
  Administered 2015-11-26 (×2): 80 ug via INTRAVENOUS
  Administered 2015-11-26: 40 ug via INTRAVENOUS
  Administered 2015-11-26 (×2): 120 ug via INTRAVENOUS

## 2015-11-26 MED ORDER — MIDAZOLAM HCL 5 MG/5ML IJ SOLN
INTRAMUSCULAR | Status: DC | PRN
  Administered 2015-11-26: 2 mg via INTRAVENOUS

## 2015-11-26 MED ORDER — BISACODYL 10 MG RE SUPP: 10.0000 mg | Freq: Every day | RECTAL | Status: DC | PRN

## 2015-11-26 SURGICAL SUPPLY — 75 items
ADH SKN CLS APL DERMABOND .7 (GAUZE/BANDAGES/DRESSINGS) ×1
APL SRG 60D 8 XTD TIP BNDBL (TIP)
BAG DECANTER FOR FLEXI CONT (MISCELLANEOUS) ×3 IMPLANT
BLADE CLIPPER SURG (BLADE) IMPLANT
BUR MATCHSTICK NEURO 3.0 LAGG (BURR) ×3 IMPLANT
CANISTER SUCT 3000ML PPV (MISCELLANEOUS) ×3 IMPLANT
CONN ADJ TRANSVERSE 41-51X5.5 (Connector) ×2 IMPLANT
CONNECTOR ADJ TRNSVR 41-51X5.5 (Connector) IMPLANT
CONT SPEC 4OZ CLIKSEAL STRL BL (MISCELLANEOUS) ×3 IMPLANT
COVER BACK TABLE 60X90IN (DRAPES) ×3 IMPLANT
DECANTER SPIKE VIAL GLASS SM (MISCELLANEOUS) ×3 IMPLANT
DERMABOND ADVANCED (GAUZE/BANDAGES/DRESSINGS) ×2
DERMABOND ADVANCED .7 DNX12 (GAUZE/BANDAGES/DRESSINGS) ×1 IMPLANT
DEVICE DISSECT PLASMABLAD 3.0S (MISCELLANEOUS) ×1 IMPLANT
DRAPE C-ARM 42X72 X-RAY (DRAPES) ×10 IMPLANT
DRAPE HALF SHEET 40X57 (DRAPES) ×2 IMPLANT
DRAPE LAPAROTOMY 100X72X124 (DRAPES) ×3 IMPLANT
DRAPE POUCH INSTRU U-SHP 10X18 (DRAPES) ×3 IMPLANT
DRSG OPSITE POSTOP 4X10 (GAUZE/BANDAGES/DRESSINGS) ×2 IMPLANT
DURAPREP 26ML APPLICATOR (WOUND CARE) ×3 IMPLANT
DURASEAL APPLICATOR TIP (TIP) IMPLANT
DURASEAL SPINE SEALANT 3ML (MISCELLANEOUS) IMPLANT
ELECT REM PT RETURN 9FT ADLT (ELECTROSURGICAL) ×3
ELECTRODE REM PT RTRN 9FT ADLT (ELECTROSURGICAL) ×1 IMPLANT
GAUZE SPONGE 4X4 12PLY STRL (GAUZE/BANDAGES/DRESSINGS) ×1 IMPLANT
GAUZE SPONGE 4X4 16PLY XRAY LF (GAUZE/BANDAGES/DRESSINGS) IMPLANT
GLOVE BIO SURGEON STRL SZ 6.5 (GLOVE) ×4 IMPLANT
GLOVE BIO SURGEONS STRL SZ 6.5 (GLOVE) ×4
GLOVE BIOGEL PI IND STRL 6.5 (GLOVE) IMPLANT
GLOVE BIOGEL PI IND STRL 7.0 (GLOVE) IMPLANT
GLOVE BIOGEL PI IND STRL 7.5 (GLOVE) IMPLANT
GLOVE BIOGEL PI IND STRL 8.5 (GLOVE) ×2 IMPLANT
GLOVE BIOGEL PI INDICATOR 6.5 (GLOVE) ×6
GLOVE BIOGEL PI INDICATOR 7.0 (GLOVE) ×2
GLOVE BIOGEL PI INDICATOR 7.5 (GLOVE) ×4
GLOVE BIOGEL PI INDICATOR 8.5 (GLOVE) ×4
GLOVE ECLIPSE 8.5 STRL (GLOVE) ×8 IMPLANT
GLOVE SS BIOGEL STRL SZ 7 (GLOVE) IMPLANT
GLOVE SUPERSENSE BIOGEL SZ 7 (GLOVE) ×2
GLOVE SURG SS PI 7.0 STRL IVOR (GLOVE) ×4 IMPLANT
GOWN STRL REUS W/ TWL LRG LVL3 (GOWN DISPOSABLE) IMPLANT
GOWN STRL REUS W/ TWL XL LVL3 (GOWN DISPOSABLE) IMPLANT
GOWN STRL REUS W/TWL 2XL LVL3 (GOWN DISPOSABLE) ×6 IMPLANT
GOWN STRL REUS W/TWL LRG LVL3 (GOWN DISPOSABLE) ×15
GOWN STRL REUS W/TWL XL LVL3 (GOWN DISPOSABLE) ×3
HEMOSTAT POWDER KIT SURGIFOAM (HEMOSTASIS) ×4 IMPLANT
KIT BASIN OR (CUSTOM PROCEDURE TRAY) ×3 IMPLANT
KIT ROOM TURNOVER OR (KITS) ×3 IMPLANT
NEEDLE HYPO 22GX1.5 SAFETY (NEEDLE) ×3 IMPLANT
NS IRRIG 1000ML POUR BTL (IV SOLUTION) ×3 IMPLANT
PACK LAMINECTOMY NEURO (CUSTOM PROCEDURE TRAY) ×3 IMPLANT
PAD ARMBOARD 7.5X6 YLW CONV (MISCELLANEOUS) ×13 IMPLANT
PATTIES SURGICAL .5 X1 (DISPOSABLE) ×5 IMPLANT
PATTIES SURGICAL 1X1 (DISPOSABLE) ×2 IMPLANT
PLASMABLADE 3.0S (MISCELLANEOUS) ×3
ROD TI CVD 5.5X90 (Rod) ×2 IMPLANT
ROD TI CVD VIT 5.5X85 (Rod) ×1 IMPLANT
SCREW VITALITY PA 6.5X45MM (Screw) ×16 IMPLANT
SPACER STR 7HX25LX10WX8 DEG PK (Spacer) ×4 IMPLANT
SPACER ZYSTON STRT 12X25X10X8 (Spacer) ×4 IMPLANT
SPACER ZYSTON STRT 8X25X10WXX8 (Spacer) ×4 IMPLANT
SPONGE LAP 4X18 X RAY DECT (DISPOSABLE) ×4 IMPLANT
SPONGE SURGIFOAM ABS GEL 100 (HEMOSTASIS) ×3 IMPLANT
SUT PROLENE 6 0 BV (SUTURE) IMPLANT
SUT VIC AB 1 CT1 18XBRD ANBCTR (SUTURE) ×1 IMPLANT
SUT VIC AB 1 CT1 8-18 (SUTURE) ×6
SUT VIC AB 2-0 CP2 18 (SUTURE) ×5 IMPLANT
SUT VIC AB 3-0 SH 8-18 (SUTURE) ×7 IMPLANT
SYR 3ML LL SCALE MARK (SYRINGE) ×12 IMPLANT
TOP CLOSURE TORQ LIMIT (Neuro Prosthesis/Implant) ×16 IMPLANT
TOWEL OR 17X24 6PK STRL BLUE (TOWEL DISPOSABLE) ×3 IMPLANT
TOWEL OR 17X26 10 PK STRL BLUE (TOWEL DISPOSABLE) ×3 IMPLANT
TRAP SPECIMEN MUCOUS 40CC (MISCELLANEOUS) ×3 IMPLANT
TRAY FOLEY W/METER SILVER 16FR (SET/KITS/TRAYS/PACK) ×3 IMPLANT
WATER STERILE IRR 1000ML POUR (IV SOLUTION) ×3 IMPLANT

## 2015-11-26 NOTE — Consult Note (Signed)
PULMONARY / CRITICAL CARE MEDICINE   Name: Kathleen Bishop MRN: [id number] DOB: 1951-02-15    ADMISSION DATE:  11/26/2015 CONSULTATION DATE:  8/29  REFERRING MD:  Danielle Dess  CHIEF COMPLAINT:  Post op evaluation  HISTORY OF PRESENT ILLNESS:   65 year old female past medical history as below which significant for diabetes, hypertension, hyperlipidemia, and chronic back pain. She hasn't had problems with back and bilateral leg pain for nearly 2 years secondary to a work-related incident. She was initially treated conservatively but pain continued to progress over time. 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 Despite continued conservative management pain has been progressive. Activities of daily living have been limited. She sought surgical evaluation by Dr. Danielle Dess, and presented to [facility] for elective laminectomy L2-L3 and L4 with decompression L2-L5. Posterior lumbar interbody arthrodesis, segmental fixation L2 L5 and posterior lateral arthrodesis L2-L5. Surgery was complicated by development of hematuria followed by poor urine output. She also developed rash during the case, which resolved with the administration of Benadryl. She was successfully extubated postoperatively and transferred to the ICU for monitoring. Estimated blood loss for the procedure is 750 mL's, 200 of which was returned to the patient via Cell Saver. PCCM has been consulted.  PAST MEDICAL HISTORY :  Past Medical History:  Diagnosis Date  . Chronic back pain    spondylolisthesis  . Diabetes mellitus without complication (HCC)    takes Metformin daily   . Eczema   . Headache    occasionally  . Hyperlipidemia    takes Simvastatin nightly  . Hypertension    takes Lisinopril daily  . Weakness    in hands and feet occasionally    PAST SURGICAL HISTORY: Past Surgical History:  Procedure Laterality Date  . ABDOMINAL HYSTERECTOMY     partial  . JOINT REPLACEMENT  Bilateral    hip    Allergies  Allergen Reactions  . No Known Allergies     No current facility-administered medications on file prior to encounter.    No current outpatient prescriptions on file prior to encounter.    FAMILY HISTORY:  Reviewed with the patient and noncontributory.  SOCIAL HISTORY: Social History  Substance Use Topics  . Smoking status: Never Smoker  . Smokeless tobacco: Never Used  . Alcohol use No    REVIEW OF SYSTEMS:   Bolds are positive  Constitutional: weight loss, gain, night sweats, Fevers, chills, fatigue .  HEENT: headaches, Sore throat, sneezing, nasal congestion, post nasal drip, Difficulty swallowing, Tooth/dental problems, visual complaints visual changes, ear ache CV:  chest pain, radiates: ,Orthopnea, PND, swelling in lower extremities, dizziness, palpitations, syncope.  GI  heartburn, indigestion, abdominal pain, nausea, vomiting, diarrhea, change in bowel habits, loss of appetite, bloody stools.  Resp: cough, productive: , hemoptysis, dyspnea, chest pain, pleuritic.  Skin: rash or itching or icterus GU: dysuria, change in color of urine, urgency or frequency. flank pain, hematuria  WU:JWJX pain at surgical site, improved or swelling. decreased range of motion  Psych: change in mood or affect. depression or anxiety.  Neuro: difficulty with speech, weakness, numbness, ataxia    SUBJECTIVE:  Feels better now than "2 hours ago"  VITAL SIGNS: BP 114/80   Pulse 82   Temp 98 F (36.7 C)   Resp 14   Ht 5\' 5"  (1.651 m)   Wt 112.5 kg (248 lb)   SpO2 100%   BMI 41.27 kg/m   HEMODYNAMICS:    VENTILATOR SETTINGS:  INTAKE / OUTPUT: I/O last 3 completed shifts: In: 3720 [I.V.:2700; Blood:270; IV Piggyback:750] Out: 1300 [Urine:600; Blood:700]  PHYSICAL EXAMINATION: General:  Obese female in no acute distress Neuro:  Somnolent but easily arouses, alert oriented HEENT:  [place]/AT, PERRL, no appreciable JVD Cardiovascular:  RRR, no  MRG Lungs:  Clear Abdomen:  Soft, non-tender, non-distended. Hyperactive BS Musculoskeletal:  No acute deformity or ROM limitation. +sensation and movement bilateral feet Skin:  Grossly intact, did not assess surgical site.  LABS:  BMET No results for input(s): NA, K, CL, CO2, BUN, CREATININE, GLUCOSE in the last 168 hours.  Electrolytes No results for input(s): CALCIUM, MG, PHOS in the last 168 hours.  CBC No results for input(s): WBC, HGB, HCT, PLT in the last 168 hours.  Coag's No results for input(s): APTT, INR in the last 168 hours.  Sepsis Markers No results for input(s): LATICACIDVEN, PROCALCITON, O2SATVEN in the last 168 hours.  ABG No results for input(s): PHART, PCO2ART, PO2ART in the last 168 hours.  Liver Enzymes No results for input(s): AST, ALT, ALKPHOS, BILITOT, ALBUMIN in the last 168 hours.  Cardiac Enzymes No results for input(s): TROPONINI, PROBNP in the last 168 hours.  Glucose  Recent Labs Lab 11/26/15 0613 11/26/15 1625 11/26/15 1849  GLUCAP 123* 193* 178*    Imaging Dg Lumbar Spine 2-3 Views  Result Date: 11/26/2015 CLINICAL DATA:  65 y/o F; posterior lumbar instrumented fusion of L2 through L5. EXAM: LUMBAR SPINE - 2-3 VIEW COMPARISON:  Intraoperative fluoroscopy 11/26/2015 FINDINGS: Posterior instrumented fusion hardware with transpedicle screws and vertical rods as well as an interbody fusion cages are present. Hardware appears intact. Status post laminectomy of fused vertebral bodies. IMPRESSION: Posterior instrumented fusion hardware appears intact. Electronically Signed   By: Mitzi HansenLance  Furusawa-Stratton M.D.   On: 11/26/2015 14:13   Dg Lumbar Spine 1 View  Result Date: 11/26/2015 CLINICAL DATA:  Localization for lumbar spine surgery EXAM: LUMBAR SPINE - 1 VIEW COMPARISON:  CT lumbar spine of 05/25/2014 FINDINGS: On this cross-table lateral portable view labeled 1, clamps are present posteriorly upon the spinous processes of L3 and L4 for  localization purposes. IMPRESSION: Clamps are noted posteriorly on the spinous processes of L3 and L4. Electronically Signed   By: Dwyane DeePaul  Barry M.D.   On: 11/26/2015 13:26   Dg C-arm 1-60 Min  Result Date: 11/26/2015 CLINICAL DATA:  PLIF of the L2-3, L3-4 and L4-5 EXAM: DG C-ARM 61-120 MIN COMPARISON:  None. FINDINGS: AP and lateral intraoperative fluoroscopic spot images show surgical changes of central canal decompression and posterior intrapedicular fixation hardware at the L2 through L5 levels, appropriately positioned, with interposed disc spacers/cages at each level. IMPRESSION: Intraoperative fluoroscopic spot images showing no evidence of surgical complicating feature. Electronically Signed   By: Bary RichardStan  Maynard M.D.   On: 11/26/2015 14:24     STUDIES:    CULTURES:   ANTIBIOTICS: Bactrim and Ancef perioperative  SIGNIFICANT EVENTS: 8/29 - Admit w/ L-spine surgery  LINES/TUBES: Foley 8/29 >> PIV x1  DISCUSSION: 65 year old who presented 8/29 for elective L-spine surgery. Perioperative course complicated by rash treated with benadryl. 750mL blood loss 200 Cell Saver. Also developed hematuria during case, and has since had poor UOP. She was extubated and was HD stable immediately post-op. PCCM consulted.  ASSESSMENT / PLAN:  PULMONARY A: No acute issues  P:   Monitor O2 sats  CARDIOVASCULAR A:  HTN, HLD  P:  Resume home lisinopril and simvastatin Telemetry monitoring  RENAL A:  Hematuria Poor urine output  P:   STAT BMP UA Gentle IVF hydration with LR  GASTROINTESTINAL A:   No acute issues  P:   Diet per neurosurgery  HEMATOLOGIC A:   Hematuria Possible ABLA  P:  STAT CBC, coags, fibrinogen Transfuse for hemoglobin < 7  INFECTIOUS A:   No acute issues  P:   Perioperative ancef, bactrim per neurosurgery  ENDOCRINE A:   DM  P:   CBG monitoring and SSI Holding Metformin  NEUROLOGIC A:   Chronic back pain s/p  laminectomy/decompression/fixation L2-L5 P:   Per neurosurgery Pain management Monitor   FAMILY  - Updates: Patient updated bedside 8/29  - Inter-disciplinary family meet or Palliative Care meeting due by:  9/5  Joneen Roach, AGACNP-BC [place] [facility] Pager [phone] or [phone]  11/27/2015 12:04 AM  PCCM Attending Note: Patient seen and examined with nurse practitioner. Please refer to his consultation note which I have reviewed. Patient with persistent oliguria. Suspect there may be some element of dehydration given tacky mucous membranes. Continuing fluid resuscitation as ordered. Postoperative care per neurosurgery for back pain. No further hypotension or rash at this time to suggest anaphylaxis. Continuing close ICU monitoring.  I spent a total of 31 minutes of critical care time today caring for the patient and reviewing the patient's electronic medical record.  Donna Christen Jamison Neighbor, M.D. [facility] Pager:  [phone] After 3pm or if no response, call [phone] 1:32 AM 11/27/15

## 2015-11-26 NOTE — Transfer of Care (Signed)
Immediate Anesthesia Transfer of Care Note  Patient: Kathleen SpearingLillie M Bishop  Procedure(s) Performed: Procedure(s): LUMBAR TWO-THREE,LUMBAR THREE-FOUR,LUMBAR FOUR-FIVE POSTERIOR LUMBAR INTERBODY FUSION. (N/A)  Patient Location: PACU  Anesthesia Type:General  Level of Consciousness: awake, alert  and patient cooperative  Airway & Oxygen Therapy: Patient Spontanous Breathing and Patient connected to face mask oxygen  Post-op Assessment: Report given to RN and Post -op Vital signs reviewed and stable  Post vital signs: Reviewed and stable  Last Vitals:  Vitals:   11/26/15 0610 11/26/15 1451  BP: 120/68   Pulse: 71   Resp: 18   Temp: 36.9 C (P) 36.2 C    Last Pain:  Vitals:   11/26/15 0628  TempSrc: Oral         Complications: No apparent anesthesia complications

## 2015-11-26 NOTE — Anesthesia Procedure Notes (Signed)
Procedure Name: Intubation Date/Time: 11/26/2015 8:42 AM Performed by: Rosiland OzMEYERS, Mykelle Cockerell Pre-anesthesia Checklist: Patient identified, Emergency Drugs available, Patient being monitored, Timeout performed and Suction available Patient Re-evaluated:Patient Re-evaluated prior to inductionOxygen Delivery Method: Circle system utilized Preoxygenation: Pre-oxygenation with 100% oxygen Intubation Type: IV induction Ventilation: Mask ventilation without difficulty and Oral airway inserted - appropriate to patient size Laryngoscope Size: Hyacinth MeekerMiller and 2 Grade View: Grade II Tube type: Oral Tube size: 7.0 mm Number of attempts: 1 Airway Equipment and Method: Stylet Placement Confirmation: ETT inserted through vocal cords under direct vision,  breath sounds checked- equal and bilateral and positive ETCO2 Secured at: 21 cm Tube secured with: Tape Dental Injury: Teeth and Oropharynx as per pre-operative assessment

## 2015-11-26 NOTE — Progress Notes (Signed)
Patient ID: Kathleen SpearingLillie M Bonsall, female   DOB: Mar 25, 1951, 65 y.o.   MRN: [id number] Vital signs are stable Patient had some moderate back pain Blood sugar has been elevated Blood clots noted in Foley and in urine postoperatively I will keep the patient in intensive care unit overnight I've asked critical care to evaluate her regarding any physiologic abnormalities.

## 2015-11-26 NOTE — OR Nursing (Signed)
Upon removing drapes it was noted that patient had raised rash. Also noted blood in the foley catheter.CRNA stated there had not been any blood in the urine during case. Surgeon aware. DDay RN.

## 2015-11-26 NOTE — H&P (Signed)
Kathleen SpearingLillie M Bishop is an 65 y.o. female.   Chief Complaint: Back and bilateral leg pain HPI: Patient is a 65 year old individual who's been having substantial problems with back and bilateral leg pain for nearly 2 years time. She was seen treated conservatively after work-related incident and had improved to the point where she could return to work however soon after that she started having worsening back and leg pain she is found to have degenerative changes with a spondylolisthesis and stenosis at L2-3 L3-4 and L4-5. After an extensive efforts at conservative management she has had persistence of pain and symptoms with pain radiating into either lower extremity at different times. She has become so limited in her activity that she can barely perform her activities of daily living. She is not been back to the workplace. She is now admitted to undergo surgical decompression and stabilization at L2-3 L3-4 and L4-5 because of severe stenosis and progressive spondylolisthesis at L2-3 and L4-5.  Past Medical History:  Diagnosis Date  . Chronic back pain    spondylolisthesis  . Diabetes mellitus without complication (HCC)    takes Metformin daily   . Eczema   . Headache    occasionally  . Hyperlipidemia    takes Simvastatin nightly  . Hypertension    takes Lisinopril daily  . Weakness    in hands and feet occasionally    Past Surgical History:  Procedure Laterality Date  . ABDOMINAL HYSTERECTOMY     partial  . JOINT REPLACEMENT Bilateral    hip    No family history on file. Social History:  reports that she has never smoked. She has never used smokeless tobacco. She reports that she does not drink alcohol or use drugs.  Allergies:  Allergies  Allergen Reactions  . No Known Allergies     Medications Prior to Admission  Medication Sig Dispense Refill  . Cholecalciferol (VITAMIN D PO) Take 1 capsule by mouth daily.    Marland Kitchen. ibuprofen (ADVIL,MOTRIN) 200 MG tablet Take 200 mg by mouth every  6 (six) hours as needed for moderate pain.    Marland Kitchen. lisinopril (PRINIVIL,ZESTRIL) 40 MG tablet Take 40 mg by mouth daily.    . metFORMIN (GLUCOPHAGE) 500 MG tablet Take 500 mg by mouth 2 (two) times daily with a meal.    . simvastatin (ZOCOR) 40 MG tablet Take 40 mg by mouth at bedtime.      Results for orders placed or performed during the hospital encounter of 11/26/15 (from the past 48 hour(s))  Glucose, capillary     Status: Abnormal   Collection Time: 11/26/15  6:13 AM  Result Value Ref Range   Glucose-Capillary 123 (H) 65 - 99 mg/dL   No results found.  Review of Systems  HENT: Negative.   Eyes: Negative.   Respiratory: Negative.   Cardiovascular: Negative.   Gastrointestinal: Negative.   Genitourinary: Negative.   Musculoskeletal: Positive for back pain.  Skin: Negative.   Neurological: Positive for dizziness, tingling, sensory change, focal weakness and weakness.  Endo/Heme/Allergies: Negative.   Psychiatric/Behavioral: Negative.     Blood pressure 120/68, pulse 71, temperature 98.4 F (36.9 C), resp. rate 18, height 5\' 5"  (1.651 m), weight 112.5 kg (248 lb), SpO2 99 %. Physical Exam  Constitutional: She is oriented to person, place, and time. She appears well-developed and well-nourished.  Obese  HENT:  Head: Normocephalic and atraumatic.  Eyes: Conjunctivae and EOM are normal. Pupils are equal, round, and reactive to light.  Neck: Normal range  of motion. Neck supple.  Cardiovascular: Normal rate and regular rhythm.   Respiratory: Effort normal and breath sounds normal.  GI: Soft. Bowel sounds are normal.  Musculoskeletal:  Stands with 30 forward stoop. Positive straight leg raising at 30 in either lower extremity. Patrick's maneuver is negative.  Neurological: She is alert and oriented to person, place, and time.  Modest weakness in both tibialis anterior 4 out of 5. No atrophy noted. Gastroc strength is normal. Deep tendon reflexes are absent in both lower  extremities.  Skin: Skin is warm and dry.  Psychiatric: She has a normal mood and affect. Her behavior is normal. Judgment and thought content normal.     Assessment/Plan Spondylolisthesis and stenosis L2-3 L3-4 L4-5 with lumbar radiculopathy.  Decompression of L2-3 L3-4 and L4-5 with more work than require for simple interbody technique. Posterior lumbar interbody arthrodesis using peek spacers local autograft and allograft. Segmental fixation L2-L5.  Stefani Dama, MD 11/26/2015, 7:30 AM

## 2015-11-26 NOTE — Op Note (Signed)
Date of surgery: 11/26/2015 Preoperative diagnosis: Spondylolisthesis L3-4 L4-5 L5-S1 with stenosis and lumbar radiculopathy Postoperative diagnosis: Same Procedure: Laminectomy L2-L3 and L4 with decompression of L2 L3 L4 and L5 nerve roots with more work than required for simple posterior lumbar interbody arthrodesis. Posterior lumbar interbody arthrodesis using peek spacers local autograft and allograft L2-3 L3-4 L4-5. Segmental fixation L2-L5 with posterior lateral arthrodesis L2-L5 Surgeon: Kathleen Bishop First assistant: Cherrie DistanceBenjamin ditty M.D. Anesthesia: Gen. endotracheal Indications: Kathleen Bishop is a 65 year old individual is had significant back and bilateral lower extremity pain. She has a spondylolisthesis at L to 334 and 45 with severe stenosis. She's been advised regarding surgical intervention.  Procedure: The patient was brought to the operating room supine on a stretcher. After the smooth induction of general endotracheal anesthesia, she was turned prone. The back was prepped with alcohol and DuraPrep and draped in a sterile fashion. Midline incision was created in the lower lumbar spine carried down to the lumbar dorsal fascia. Fascia was opened on either side of midline to expose the first spinous processes that were identified as L3 and L4. Then by dissecting subperiosteally and the lumbar spine L3 L4 L5 and L2 each identified and packed away. Self-retaining retractors were placed deep into the wound to maintain retraction. Then after adequate release of the ligamentous tissue complete laminectomies at L3 and L4 were completed L5 was then undercut from the superior aspect and L2 had significant laminectomy removing the entire laminar arch out to and including the facet at L2-L3. This allowed for decompression of the L2 nerve root superiorly with a 2 mm Kerrison punch and a high-speed drill. Then the dissection was carried inferiorly to decompress the L3 the L4 and L5 nerve roots  sequentially. Hemostasis in the epidural spaces was obtained meticulously with bipolar cautery.  Total discectomies were then performed at the L2-3 L3-4 and L4-5 level using a 15 blade to open the posterior longitudinal ligament and then remove the entirety of the disc at each of these levels curettes were used to decorticate the endplates at L2-3 U0-43-4 and L4-5 starting at L4-5 and then the interspace was prepared for grafting by using a series of tooth curettes to remove the endplate material and its totality. At the L4-5 level 12 mm tall 25 mm long 10 mm wide 8 lordotic cages were filled with autograft and placed into the interspace along with a total of 9 mL of bone graft. At L3-4 and 8 mm tall 25 mm long tendon millimeter wide 8 lordotic spacer was similarly placed into the interspace after adequate preparation and at L2-3 7 mm tall 25 mm long 10 mm wide by 8 of lordosis cages were packed into the interspace along with 6 mL of local autograft. 9 mL of autograft were packed into the L3-4 space. With the cages being placed in the nerve roots carefully being decompressed and epidural vessels pain checked for hemostasis the pedicle entry sites were next chosen at L2 L3 L4 and L5 6.5 x 45 mm screws were placed in each of these openings after passing a probe tapping the pedicle and placing a pedicle screw. Once pedicle screws were placed 85 mm rod was used to connect the pedicle screws on the right side 90 mm rod was used on the left side. Rods were then tightened in a neutral construct. Lateral gutters which had previously been decorticated were then filled with the remainder of autograft. Hemostasis was again checked and then after verifying good positioning of the  hardware and laying down to bone graft the transverse connector was placed between the rods and then the retractors were removed final radiographs were obtained and the lumbar dorsal fascia was closed with #1 Vicryl. 23 mL of half percent Marcaine  was injected into the paraspinous fascia and 2-0 Vicryl was used in the subcutaneous tissues and 3-0 Vicryl subcuticularly. Blood loss for the procedure was estimated at 750 mL approximately 200 mL of Cell Saver blood was returned to the patient.

## 2015-11-26 NOTE — Anesthesia Postprocedure Evaluation (Signed)
Anesthesia Post Note  Patient: Kathleen SpearingLillie M Bishop  Procedure(s) Performed: Procedure(s) (LRB): LUMBAR TWO-THREE,LUMBAR THREE-FOUR,LUMBAR FOUR-FIVE POSTERIOR LUMBAR INTERBODY FUSION. (N/A)  Patient location during evaluation: PACU Anesthesia Type: General Level of consciousness: awake, awake and alert and oriented Pain management: pain level controlled Vital Signs Assessment: post-procedure vital signs reviewed and stable Respiratory status: spontaneous breathing, nonlabored ventilation and respiratory function stable Cardiovascular status: blood pressure returned to baseline Anesthetic complications: no    Last Vitals:  Vitals:   11/26/15 1600 11/26/15 1636  BP: 100/76   Pulse: 82 84  Resp: 18 15  Temp:  36.3 C    Last Pain:  Vitals:   11/26/15 1520  TempSrc:   PainSc: Asleep                 Adriano Bischof COKER

## 2015-11-26 NOTE — Anesthesia Preprocedure Evaluation (Signed)
Anesthesia Evaluation  Patient identified by MRN, date of birth, ID band Patient awake    Reviewed: Allergy & Precautions, NPO status , Patient's Chart, lab work & pertinent test results  Airway Mallampati: II  TM Distance: >3 FB Neck ROM: Full    Dental  (+) Teeth Intact, Dental Advisory Given   Pulmonary    breath sounds clear to auscultation       Cardiovascular hypertension,  Rhythm:Regular Rate:Normal     Neuro/Psych    GI/Hepatic   Endo/Other  diabetes  Renal/GU      Musculoskeletal   Abdominal (+) + obese,   Peds  Hematology   Anesthesia Other Findings   Reproductive/Obstetrics                             Anesthesia Physical Anesthesia Plan  ASA: III  Anesthesia Plan: General   Post-op Pain Management:    Induction: Intravenous  Airway Management Planned: Oral ETT  Additional Equipment:   Intra-op Plan:   Post-operative Plan: Extubation in OR  Informed Consent: I have reviewed the patients History and Physical, chart, labs and discussed the procedure including the risks, benefits and alternatives for the proposed anesthesia with the patient or authorized representative who has indicated his/her understanding and acceptance.   Dental advisory given  Plan Discussed with: CRNA and Anesthesiologist  Anesthesia Plan Comments: (Lumbar Spondylolisthesis Type 2 DM glucose 123 Hypertension  Obesity  Plan GA with oral ETT  Kathleen Bishop)        Anesthesia Quick Evaluation

## 2015-11-27 DIAGNOSIS — N179 Acute kidney failure, unspecified: Secondary | ICD-10-CM

## 2015-11-27 DIAGNOSIS — G8918 Other acute postprocedural pain: Secondary | ICD-10-CM

## 2015-11-27 LAB — BASIC METABOLIC PANEL
ANION GAP: 5 (ref 5–15)
Anion gap: 9 (ref 5–15)
BUN: 16 mg/dL (ref 6–20)
BUN: 17 mg/dL (ref 6–20)
CALCIUM: 7.7 mg/dL — AB (ref 8.9–10.3)
CHLORIDE: 106 mmol/L (ref 101–111)
CO2: 22 mmol/L (ref 22–32)
CO2: 22 mmol/L (ref 22–32)
CREATININE: 1.6 mg/dL — AB (ref 0.44–1.00)
CREATININE: 1.59 mg/dL — AB (ref 0.44–1.00)
Calcium: 8.1 mg/dL — ABNORMAL LOW (ref 8.9–10.3)
Chloride: 108 mmol/L (ref 101–111)
GFR calc Af Amer: 39 mL/min — ABNORMAL LOW (ref >60)
GFR calc Af Amer: 39 mL/min — ABNORMAL LOW (ref >60)
GFR calc non Af Amer: 33 mL/min — ABNORMAL LOW (ref >60)
GFR calc non Af Amer: 34 mL/min — ABNORMAL LOW (ref >60)
GLUCOSE: 144 mg/dL — AB (ref 65–99)
Glucose, Bld: 145 mg/dL — ABNORMAL HIGH (ref 65–99)
POTASSIUM: 4.2 mmol/L (ref 3.5–5.1)
Potassium: 5.2 mmol/L — ABNORMAL HIGH (ref 3.5–5.1)
SODIUM: 135 mmol/L (ref 135–145)
Sodium: 137 mmol/L (ref 135–145)

## 2015-11-27 LAB — CBC
HEMATOCRIT: 27.5 % — AB (ref 36.0–46.0)
HEMATOCRIT: 25.2 % — AB (ref 36.0–46.0)
HEMOGLOBIN: 8.9 g/dL — AB (ref 12.0–15.0)
HEMOGLOBIN: 8.2 g/dL — AB (ref 12.0–15.0)
MCH: 29.5 pg (ref 26.0–34.0)
MCH: 29.4 pg (ref 26.0–34.0)
MCHC: 32.4 g/dL (ref 30.0–36.0)
MCHC: 32.5 g/dL (ref 30.0–36.0)
MCV: 91.1 fL (ref 78.0–100.0)
MCV: 90.3 fL (ref 78.0–100.0)
Platelets: 175 10*3/uL (ref 150–400)
Platelets: 134 10*3/uL — ABNORMAL LOW (ref 150–400)
RBC: 3.02 MIL/uL — ABNORMAL LOW (ref 3.87–5.11)
RBC: 2.79 MIL/uL — ABNORMAL LOW (ref 3.87–5.11)
RDW: 13.5 % (ref 11.5–15.5)
RDW: 13.4 % (ref 11.5–15.5)
WBC: 9.2 10*3/uL (ref 4.0–10.5)
WBC: 9.9 10*3/uL (ref 4.0–10.5)

## 2015-11-27 LAB — GLUCOSE, CAPILLARY
GLUCOSE-CAPILLARY: 108 mg/dL — AB (ref 65–99)
Glucose-Capillary: 141 mg/dL — ABNORMAL HIGH (ref 65–99)
Glucose-Capillary: 125 mg/dL — ABNORMAL HIGH (ref 65–99)
Glucose-Capillary: 104 mg/dL — ABNORMAL HIGH (ref 65–99)
Glucose-Capillary: 121 mg/dL — ABNORMAL HIGH (ref 65–99)

## 2015-11-27 LAB — FIBRINOGEN: Fibrinogen: 374 mg/dL (ref 210–475)

## 2015-11-27 LAB — PROTIME-INR
INR: 1.15
Prothrombin Time: 14.8 seconds (ref 11.4–15.2)

## 2015-11-27 LAB — LACTIC ACID, PLASMA: Lactic Acid, Venous: 0.8 mmol/L (ref 0.5–1.9)

## 2015-11-27 MED ORDER — INSULIN ASPART 100 UNIT/ML ~~LOC~~ SOLN
0.0000 [IU] | SUBCUTANEOUS | Status: DC
  Administered 2015-11-27 – 2015-11-30 (×11): 1 [IU] via SUBCUTANEOUS

## 2015-11-27 MED ORDER — SODIUM CHLORIDE 0.9 % IV BOLUS (SEPSIS)
2000.0000 mL | Freq: Once | INTRAVENOUS | Status: AC
  Administered 2015-11-27: 2000 mL via INTRAVENOUS

## 2015-11-27 MED ORDER — HYDROMORPHONE HCL 1 MG/ML IJ SOLN: 0.5000 mg | INTRAMUSCULAR | Status: DC | PRN

## 2015-11-27 MED ORDER — SODIUM CHLORIDE 0.9 % IV SOLN
INTRAVENOUS | Status: DC
  Administered 2015-11-27: 18:00:00 via INTRAVENOUS

## 2015-11-27 MED ORDER — FENTANYL CITRATE (PF) 100 MCG/2ML IJ SOLN
50.0000 ug | INTRAMUSCULAR | Status: DC | PRN
Start: 2015-11-27 — End: 2015-12-04
  Administered 2015-11-27: 100 ug via INTRAVENOUS
  Administered 2015-11-27 (×2): 50 ug via INTRAVENOUS
  Administered 2015-11-28 (×2): 100 ug via INTRAVENOUS
  Administered 2015-11-28: 50 ug via INTRAVENOUS
  Administered 2015-11-30 – 2015-12-03 (×8): 100 ug via INTRAVENOUS
  Filled 2015-11-27 (×16): qty 2

## 2015-11-27 MED ORDER — SODIUM CHLORIDE 0.9 % IV BOLUS (SEPSIS)
1000.0000 mL | Freq: Once | INTRAVENOUS | Status: AC
  Administered 2015-11-27: 1000 mL via INTRAVENOUS

## 2015-11-27 MED FILL — Heparin Sodium (Porcine) Inj 1000 Unit/ML: INTRAMUSCULAR | Qty: 30 | Status: AC

## 2015-11-27 MED FILL — Sodium Chloride IV Soln 0.9%: INTRAVENOUS | Qty: 2000 | Status: AC

## 2015-11-27 NOTE — Consult Note (Signed)
PULMONARY / CRITICAL CARE MEDICINE   Name: Kathleen SpearingLillie M Bishop MRN: [id number] DOB: Apr 23, 1950    ADMISSION DATE:  11/26/2015 CONSULTATION DATE:  8/29  REFERRING MD:  Danielle DessElsner  CHIEF COMPLAINT:  Post op evaluation  BRIEF 65 year old female past medical history as below which significant for diabetes, hypertension, hyperlipidemia, and chronic back pain. She hasn't had problems with back and bilateral leg pain for nearly 2 years secondary to a work-related incident. She was initially treated conservatively but pain continued to progress over time. 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 Despite continued conservative management pain has been progressive. Activities of daily living have been limited. She sought surgical evaluation by Dr. Danielle DessElsner, and presented [facility] for elective laminectomy L2-L3 and L4 with decompression L2-L5. Posterior lumbar interbody arthrodesis, segmental fixation L2 L5 and posterior lateral arthrodesis L2-L5. Surgery was complicated by development of hematuria followed by poor urine output. She also developed rash during the case, which resolved with the administration of Benadryl. She was successfully extubated postoperatively and transferred to the ICU for monitoring. Estimated blood loss for the procedure is 750 mL's, 200 of which was returned to the patient via Cell Saver. PCCM has been consulted.   STUDIES:    CULTURES:   ANTIBIOTICS: Bactrim and Ancef perioperative   LINES/TUBES: Foley 8/29 >> PIV x1   SIGNIFICANT EVENTS: 8/29 - Admit w/ L-spine surgery    SUBJECTIVE/OVERNIGHT/INTERVAL HX 11/27/15 - RN says pre-op and post op hgb shows > 5gm% drop. Currently very difficult IV stick. Making very little urine only. Not intubated. Noted to be on oxy, lisinopril and toraldaol. Patient asking for lot of pain med per RN  VITAL SIGNS: BP (!) 94/51   Pulse 92   Temp 98.4 F (36.9 C) (Oral)   Resp 15   Ht  5\' 5"  (1.651 m)   Wt 112.5 kg (248 lb)   SpO2 94%   BMI 41.27 kg/m   INTAKE / OUTPUT: I/O last 3 completed shifts: In: 4831.7 [I.V.:3811.7; Blood:270; IV Piggyback:750] Out: 1625 [Urine:925; Blood:700]  PHYSICAL EXAMINATION: General:  Obese female in no acute distress. LOOKS DRY Neuro:  Somnolent but easily arouses, alert oriented HEENT:  Purple Sage/AT, PERRL, no appreciable JVD Cardiovascular:  RRR, no MRG Lungs:  Clear Abdomen:  Soft, non-tender, non-distended. Hyperactive BS Musculoskeletal:  No acute deformity or ROM limitation. +sensation and movement bilateral feet Skin:  Grossly intact, did not assess surgical site.  LABS: PULMONARY No results for input(s): PHART, PCO2ART, PO2ART, HCO3, TCO2, O2SAT in the last 168 hours.  Invalid input(s): PCO2, PO2  CBC  Recent Labs Lab 11/27/15 0756  HGB 8.9*  HCT 27.5*  WBC 9.2  PLT 175    COAGULATION  Recent Labs Lab 11/27/15 0756  INR 1.15    CARDIAC  No results for input(s): TROPONINI in the last 168 hours. No results for input(s): PROBNP in the last 168 hours.   CHEMISTRY  Recent Labs Lab 11/27/15 0546  NA 137  K 5.2*  CL 106  CO2 22  GLUCOSE 145*  BUN 16  CREATININE 1.60*  CALCIUM 8.1*   Estimated Creatinine Clearance: 45.6 mL/min (by C-G formula based on SCr of 1.6 mg/dL).   LIVER  Recent Labs Lab 11/27/15 0756  INR 1.15     INFECTIOUS No results for input(s): LATICACIDVEN, PROCALCITON in the last 168 hours.   ENDOCRINE CBG (last 3)   Recent Labs  11/26/15 1849 11/27/15 0739 11/27/15 1113  GLUCAP  178* 141* 125*         IMAGING x48h  - image(s) personally visualized  -   highlighted in bold Dg Lumbar Spine 2-3 Views  Result Date: 11/26/2015 CLINICAL DATA:  65 y/o F; posterior lumbar instrumented fusion of L2 through L5. EXAM: LUMBAR SPINE - 2-3 VIEW COMPARISON:  Intraoperative fluoroscopy 11/26/2015 FINDINGS: Posterior instrumented fusion hardware with transpedicle screws and  vertical rods as well as an interbody fusion cages are present. Hardware appears intact. Status post laminectomy of fused vertebral bodies. IMPRESSION: Posterior instrumented fusion hardware appears intact. Electronically Signed   By: Mitzi Hansen M.D.   On: 11/26/2015 14:13   Dg Lumbar Spine 1 View  Result Date: 11/26/2015 CLINICAL DATA:  Localization for lumbar spine surgery EXAM: LUMBAR SPINE - 1 VIEW COMPARISON:  CT lumbar spine of 05/25/2014 FINDINGS: On this cross-table lateral portable view labeled 1, clamps are present posteriorly upon the spinous processes of L3 and L4 for localization purposes. IMPRESSION: Clamps are noted posteriorly on the spinous processes of L3 and L4. Electronically Signed   By: Dwyane Dee M.D.   On: 11/26/2015 13:26   Dg C-arm 1-60 Min  Result Date: 11/26/2015 CLINICAL DATA:  PLIF of the L2-3, L3-4 and L4-5 EXAM: DG C-ARM 61-120 MIN COMPARISON:  None. FINDINGS: AP and lateral intraoperative fluoroscopic spot images show surgical changes of central canal decompression and posterior intrapedicular fixation hardware at the L2 through L5 levels, appropriately positioned, with interposed disc spacers/cages at each level. IMPRESSION: Intraoperative fluoroscopic spot images showing no evidence of surgical complicating feature. Electronically Signed   By: Bary Richard M.D.   On: 11/26/2015 14:24     DISCUSSION: 65 year old who presented 8/29 for elective L-spine surgery. Perioperative course complicated by rash treated with benadryl. blood loss 200 Cell Saver. Also developed hematuria during case, and has since had poor UOP. She was extubated and was HD stable immediately post-op. PCCM consulted.  ASSESSMENT / PLAN:  PULMONARY A: No acute issues  P:   Monitor O2 sats  CARDIOVASCULAR A:  HTN, HLD  P:  Dc home  lisinopril and simvastatin due to anuria Telemetry monitoring Check tro  RENAL A:   Hematuria Poor urine output  - devoloping  AKI - probabluy related to volume loss  P:   2L bolus and continue fluid at 100cc/h Recheck bmet, lactat at 3pm (Picc line if tough stick +) Hold metformin Dc lisinopril for now Dc zocor DC oxycodone DC toradol  GASTROINTESTINAL A:   No acute issues  P:   Diet per neurosurgery  HEMATOLOGIC A:   Hematuria Possible ABLA  P:  STAT CBC, coags, fibrinogen Transfuse for hemoglobin < 7  INFECTIOUS A:   No acute issues  P:   Perioperative ancef, bactrim per neurosurgery  ENDOCRINE A:   DM  P:   CBG monitoring and SSI Holding Metformin  NEUROLOGIC A:   Chronic back pain s/p laminectomy/decompression/fixation L2-L5   - now with acute post op and chronic back pain P:   Per neurosurgery Pain management - simplify to fentanyl and dilaudid IV prn; consider PCA if needed Monitor   FAMILY  - Updates: Patient updated bedside 8/29. Sleepy 11/27/15  And not updated  - Inter-disciplinary family meet or Palliative Care meeting due by:  9/5     Dr. Kalman Shan, M.D., [facility].P Pulmonary and Critical Care Medicine Staff Physician [facility] [place] [facility] Pager: [phone], If no answer or between  15:00h - 7:00h: call [phone]  11/27/2015 11:53 AM

## 2015-11-27 NOTE — Progress Notes (Signed)
Orthopedic Tech Progress Note Patient Details:  Kathleen SpearingLillie M Bishop 11-05-1950 [id number]  Patient ID: Kathleen SpearingLillie M Bishop, female   DOB: 11-05-1950, 65 y.o.   MRN: [id number]   Kathleen DomCrawford, Kathleen Bishop 11/27/2015, 9:12 AM Called in bio-tech brace order; spoke with Judeth CornfieldStephanie

## 2015-11-27 NOTE — NC FL2 (Signed)
[place] MEDICAID FL2 LEVEL OF CARE SCREENING TOOL     IDENTIFICATION  Patient Name: Kathleen SpearingLillie M Bishop Birthdate: 14-Jun-1950 Sex: female Admission Date (Current Location): 11/26/2015  [facility] [place]   Facility and Address:  [facility], [address], [place], [place] [address]      Provider Number: [id number]  Attending Physician Name and Address:  Barnett AbuHenry Elsner, MD  Relative Name and Phone Number:  Lewayne BuntingFannie Radin (sister) [phone]    Current Level of Care: SNF Recommended Level of Care: Skilled Nursing Facility Prior Approval Number:    Date Approved/Denied:   PASRR Number: [id number] [id number]  Discharge Plan: SNF    Current Diagnoses: Patient Active Problem List   Diagnosis Date Noted  . Lumbar stenosis 11/26/2015    Orientation RESPIRATION BLADDER Height & Weight     Self, Time, Situation, Place  O2 (2L nasal cannula) Continent, Indwelling catheter Weight: 248 lb (112.5 kg) Height:  5\' 5"  (165.1 cm)  BEHAVIORAL SYMPTOMS/MOOD NEUROLOGICAL BOWEL NUTRITION STATUS   (NONE)  (NONE) Continent Diet (Clear Liquid Diet)  AMBULATORY STATUS COMMUNICATION OF NEEDS Skin   Extensive Assist Verbally Surgical wounds (closed incision (back) honeycomb dressing )                       Personal Care Assistance Level of Assistance  Bathing, Feeding, Dressing Bathing Assistance: Maximum assistance Feeding assistance: Independent Dressing Assistance: Maximum assistance     Functional Limitations Info  Sight, Hearing, Speech Sight Info: Adequate Hearing Info: Adequate Speech Info: Adequate    SPECIAL CARE FACTORS FREQUENCY  PT (By licensed PT), OT (By licensed OT)     PT Frequency: min 5x/week OT Frequency: min 2x/week            Contractures Contractures Info: Not present    Additional Factors Info  Code Status, Allergies, Insulin Sliding Scale Code Status Info: FULL Allergies Info: No Known Allergies    Insulin Sliding Scale Info: Novolog 0-9 units every 4 hours       Current Medications (11/27/2015):  This is the current hospital active medication list Current Facility-Administered Medications  Medication Dose Route Frequency Provider Last Rate Last Dose  . 0.9 %  sodium chloride infusion  250 mL Intravenous Continuous Barnett AbuHenry Elsner, MD      . 0.9 %  sodium chloride infusion   Intravenous Continuous Barnett AbuHenry Elsner, MD 100 mL/hr at 11/26/15 2000    . acetaminophen (TYLENOL) tablet 650 mg  650 mg Oral Q4H PRN Barnett AbuHenry Elsner, MD       Or  . acetaminophen (TYLENOL) suppository 650 mg  650 mg Rectal Q4H PRN Barnett AbuHenry Elsner, MD      . alum & mag hydroxide-simeth (MAALOX/MYLANTA) 200-200-20 MG/5ML suspension 30 mL  30 mL Oral Q6H PRN Barnett AbuHenry Elsner, MD      . bisacodyl (DULCOLAX) suppository 10 mg  10 mg Rectal Daily PRN Barnett AbuHenry Elsner, MD      . diazepam (VALIUM) tablet 5 mg  5 mg Oral Q6H PRN Barnett AbuHenry Elsner, MD   5 mg at 11/27/15 0243  . docusate sodium (COLACE) capsule 100 mg  100 mg Oral BID Barnett AbuHenry Elsner, MD   100 mg at 11/27/15 1049  . insulin aspart (novoLOG) injection 0-9 Units  0-9 Units Subcutaneous Q4H Roslynn AmbleJennings E Nestor, MD   1 Units at 11/27/15 0815  . ketorolac (TORADOL) 15 MG/ML injection 15 mg  15 mg Intravenous Q6H Barnett AbuHenry Elsner, MD  15 mg at 11/27/15 0600  . lisinopril (PRINIVIL,ZESTRIL) tablet 40 mg  40 mg Oral Daily Barnett Abu, MD   40 mg at 11/27/15 1049  . menthol-cetylpyridinium (CEPACOL) lozenge 3 mg  1 lozenge Oral PRN Barnett Abu, MD       Or  . phenol (CHLORASEPTIC) mouth spray 1 spray  1 spray Mouth/Throat PRN Barnett Abu, MD      . morphine 2 MG/ML injection 1-4 mg  1-4 mg Intravenous Q3H PRN Barnett Abu, MD   4 mg at 11/27/15 0601  . ondansetron (ZOFRAN) injection 4 mg  4 mg Intravenous Q4H PRN Barnett Abu, MD   4 mg at 11/27/15 0913  . oxyCODONE-acetaminophen (PERCOCET/ROXICET) 5-325 MG per tablet 1-2 tablet  1-2 tablet Oral Q4H PRN Barnett Abu, MD   2 tablet at 11/27/15  0815  . pneumococcal 23 valent vaccine (PNU-IMMUNE) injection 0.5 mL  0.5 mL Intramuscular Prior to discharge Barnett Abu, MD      . polyethylene glycol (MIRALAX / GLYCOLAX) packet 17 g  17 g Oral Daily PRN Barnett Abu, MD      . senna (SENOKOT) tablet 8.6 mg  1 tablet Oral BID Barnett Abu, MD   8.6 mg at 11/27/15 1049  . simvastatin (ZOCOR) tablet 40 mg  40 mg Oral QHS Barnett Abu, MD   40 mg at 11/26/15 2137  . sodium chloride flush (NS) 0.9 % injection 3 mL  3 mL Intravenous Q12H Barnett Abu, MD      . sodium chloride flush (NS) 0.9 % injection 3 mL  3 mL Intravenous PRN Barnett Abu, MD         Discharge Medications: Please see discharge summary for a list of discharge medications.  Relevant Imaging Results:  Relevant Lab Results:   Additional Information SS #: [id number] Pt. has worker's comp claim  Rockwell [place], LCSW

## 2015-11-27 NOTE — Evaluation (Signed)
Occupational Therapy Evaluation Patient Details Name: Kathleen Bishop MRN: [id number] DOB: 1950/05/21 Today's Date: 11/27/2015    History of Present Illness 65 year old female past medical history significant for diabetes, hypertension, hyperlipidemia, and chronic back pain, pt now s/p spinal sx.   Clinical Impression   Pt reports she was fairly independent with ADL PTA; occasionally sister would have to assist. Currently pt mod assist +2 for bed mobility. OT evaluation limited by low BP, pt c/o dizziness with positional changes, and no brace present in room. BP 92/62 in supine, 86/55 in sit, 97/63 in sit after ~3 minutes. Began education on back precautions and proper technique for bed mobility. Recommending SNF for follow up to maximize independence and safety with ADL and functional mobility prior to return home. Pt would benefit from continued skilled OT to address established goals.    Follow Up Recommendations  SNF;Supervision/Assistance - 24 hour    Equipment Recommendations  Other (comment) (TBD at next venue)    Recommendations for Other Services       Precautions / Restrictions Precautions Precautions: Fall;Back Precaution Comments: verbally reviewed with patient Required Braces or Orthoses: Spinal Brace Spinal Brace: Lumbar corset (no brace present on eval) Restrictions Weight Bearing Restrictions: No      Mobility Bed Mobility Overal bed mobility: Needs Assistance;+2 for physical assistance Bed Mobility: Rolling;Sidelying to Sit;Sit to Sidelying Rolling: Mod assist Sidelying to sit: Mod assist;+2 for physical assistance       General bed mobility comments: Assist for LEs and trunk. Heavy assist required for trunk supine <> sit. Cues for technique. Pt reaching to assist with rolling.  Transfers                 General transfer comment: Not assessed at this time.    Balance Overall balance assessment: Needs assistance Sitting-balance support: Feet  unsupported;Single extremity supported Sitting balance-Leahy Scale: Fair Sitting balance - Comments: Initially required min assist for sitting balance, progressed to supervision.                                    ADL Overall ADL's : Needs assistance/impaired Eating/Feeding: Set up;Sitting   Grooming: Set up;Min guard;Wash/dry face   Upper Body Bathing: Moderate assistance;Sitting   Lower Body Bathing: Maximal assistance;Sitting/lateral leans   Upper Body Dressing : Moderate assistance;Sitting   Lower Body Dressing: Maximal assistance;Sitting/lateral leans                 General ADL Comments: Did not attempt OOB this session due to low BP, c/o dizziness with positional changes, and no brace present (RN aware). BP in supine 92/62, sitting 86/55, sit after ~3 minutes 97/63. Educated on back precautions and log roll technique for bed mobility.       Vision Additional Comments: Needs further assessment.   Perception     Praxis      Pertinent Vitals/Pain Pain Assessment: 0-10 Pain Score: 8  Pain Location: back and right leg Pain Descriptors / Indicators: Sore Pain Intervention(s): Monitored during session     Hand Dominance Right   Extremity/Trunk Assessment Upper Extremity Assessment Upper Extremity Assessment: Overall WFL for tasks assessed   Lower Extremity Assessment Lower Extremity Assessment: Generalized weakness   Cervical / Trunk Assessment Cervical / Trunk Assessment:  (increased body habitus)   Communication Communication Communication: No difficulties   Cognition Arousal/Alertness: Awake/alert Behavior During Therapy: Flat affect Overall Cognitive Status: Within Functional Limits  for tasks assessed                     General Comments       Exercises       Shoulder Instructions      Home Living Family/patient expects to be discharged to:: Private residence Living Arrangements: Alone   Type of Home: House Home  Access: Stairs to enter Entergy CorporationEntrance Stairs-Number of Steps: 2 Entrance Stairs-Rails: None Home Layout: One level     Bathroom Shower/Tub: IT trainerTub/shower unit;Curtain   Bathroom Toilet: Standard     Home Equipment: Environmental consultantWalker - 2 wheels;Cane - single point          Prior Functioning/Environment Level of Independence: Independent with assistive device(s)        Comments: cane for mobility    OT Diagnosis: Generalized weakness;Acute pain   OT Problem List: Decreased strength;Decreased range of motion;Decreased activity tolerance;Impaired balance (sitting and/or standing);Decreased knowledge of use of DME or AE;Decreased knowledge of precautions;Impaired sensation;Obesity;Pain   OT Treatment/Interventions: Self-care/ADL training;Neuromuscular education;Energy conservation;DME and/or AE instruction;Therapeutic activities;Patient/family education;Balance training    OT Goals(Current goals can be found in the care plan section) Acute Rehab OT Goals Patient Stated Goal: rehab before home OT Goal Formulation: With patient Time For Goal Achievement: 12/11/15 Potential to Achieve Goals: Good ADL Goals Pt Will Perform Grooming: with min guard assist;standing Pt Will Perform Lower Body Bathing: with min guard assist;sit to/from stand (with or without AE) Pt Will Perform Lower Body Dressing: with min guard assist;sit to/from stand (with or without AE) Pt Will Transfer to Toilet: with min guard assist;ambulating;bedside commode Pt Will Perform Toileting - Clothing Manipulation and hygiene: with min guard assist;sit to/from stand (with or without AE) Additional ADL Goal #1: Pt will independently verbally recall 3/3 back precautions and maintain throughout ADL. Additional ADL Goal #2: Pt will don/doff back brace with set up as precursor to ADL and functional mobility.  OT Frequency: Min 2X/week   Barriers to D/C: Decreased caregiver support  pt lives alone       Co-evaluation PT/OT/SLP  Co-Evaluation/Treatment: Yes Reason for Co-Treatment: For patient/therapist safety;Complexity of the patient's impairments (multi-system involvement) PT goals addressed during session: Mobility/safety with mobility OT goals addressed during session: ADL's and self-care      End of Session Nurse Communication: Mobility status;Other (comment) (need brace)  Activity Tolerance: Other (comment) (Limited by dizziness) Patient left: in bed;with call bell/phone within reach;with SCD's reapplied   Time: 0902-0930 OT Time Calculation (min): 28 min Charges:  OT General Charges $OT Visit: 1 Procedure OT Evaluation $OT Eval Moderate Complexity: 1 Procedure G-Codes:     Gaye AlkenBailey A Alda Gaultney M.S., OTR/L Pager: [phone]  11/27/2015, 10:18 AM

## 2015-11-27 NOTE — Evaluation (Signed)
Physical Therapy Evaluation Patient Details Name: Kathleen Bishop MRN: [id number] DOB: [birth year] Today's Date: 11/27/2015   History of Present Illness  65 year old female past medical history significant for diabetes, hypertension, hyperlipidemia, and chronic back pain, pt now s/p spinal sx.  Clinical Impression  Patient demonstrates deficits in functional mobility as indicated below. Will need continue skilled PT to address deficits and maximize function will see as indicated and progress as tolerated. Recommend ST SNF upon acute discharge. PT evaluation limited by low BP, pt c/o dizziness with positional changes, and no brace present in room. BP 92/62 in supine, 86/55 in sit, 97/63 in sit after ~3 minutes    Follow Up Recommendations SNF;Supervision/Assistance - 24 hour    Equipment Recommendations  3in1 (PT)    Recommendations for Other Services       Precautions / Restrictions Precautions Precautions: Fall;Back Precaution Comments: verbally reviewed with patient Required Braces or Orthoses: Spinal Brace Spinal Brace: Lumbar corset (no brace present on eval) Restrictions Weight Bearing Restrictions: No      Mobility  Bed Mobility Overal bed mobility: Needs Assistance;+2 for physical assistance Bed Mobility: Rolling;Sidelying to Sit;Sit to Sidelying Rolling: Mod assist Sidelying to sit: Mod assist;+2 for physical assistance       General bed mobility comments: Assist for LEs and trunk. Heavy assist required for trunk supine <> sit. Cues for technique. Pt reaching to assist with rolling.  Transfers                 General transfer comment: Not assessed at this time.  Ambulation/Gait                Stairs            Wheelchair Mobility    Modified Rankin (Stroke Patients Only)       Balance Overall balance assessment: Needs assistance Sitting-balance support: Feet unsupported;Single extremity supported Sitting balance-Leahy Scale:  Fair Sitting balance - Comments: Initially required min assist for sitting balance, progressed to supervision.                                     Pertinent Vitals/Pain Pain Assessment: 0-10 Pain Score: 8  Pain Location: back and right leg Pain Descriptors / Indicators: Sore Pain Intervention(s): Monitored during session    Home Living Family/patient expects to be discharged to:: Private residence Living Arrangements: Alone   Type of Home: House Home Access: Stairs to enter Entrance Stairs-Rails: None Entrance Stairs-Number of Steps: 2 Home Layout: One level Home Equipment: Environmental consultantWalker - 2 wheels;Cane - single point      Prior Function Level of Independence: Independent with assistive device(s)         Comments: cane for mobility     Hand Dominance   Dominant Hand: Right    Extremity/Trunk Assessment   Upper Extremity Assessment: Overall WFL for tasks assessed           Lower Extremity Assessment: Generalized weakness      Cervical / Trunk Assessment:  (increased body habitus)  Communication   Communication: No difficulties  Cognition Arousal/Alertness: Awake/alert Behavior During Therapy: Flat affect Overall Cognitive Status: Within Functional Limits for tasks assessed                      General Comments General comments (skin integrity, edema, etc.): Patient tolerate >10 mins EOB with min guard, increased dizziness and low BP noted.  Exercises        Assessment/Plan    PT Assessment Patient needs continued PT services  PT Diagnosis Difficulty walking;Abnormality of gait;Generalized weakness;Acute pain   PT Problem List Decreased strength;Decreased activity tolerance;Decreased balance;Decreased mobility;Decreased coordination;Decreased cognition;Decreased safety awareness  PT Treatment Interventions DME instruction;Gait training;Stair training;Functional mobility training;Therapeutic activities;Therapeutic exercise;Balance  training;Patient/family education   PT Goals (Current goals can be found in the Care Plan section) Acute Rehab PT Goals Patient Stated Goal: rehab before home PT Goal Formulation: With patient Time For Goal Achievement: 12/11/15 Potential to Achieve Goals: Good    Frequency Min 5X/week   Barriers to discharge        Co-evaluation PT/OT/SLP Co-Evaluation/Treatment: Yes Reason for Co-Treatment: For patient/therapist safety;Complexity of the patient's impairments (multi-system involvement) PT goals addressed during session: Mobility/safety with mobility OT goals addressed during session: ADL's and self-care       End of Session   Activity Tolerance: Treatment limited secondary to medical complications (Comment) (dizziness) Patient left: in bed;with call bell/phone within reach Nurse Communication: Mobility status         Time: 0902-0930 PT Time Calculation (min) (ACUTE ONLY): 28 min   Charges:   PT Evaluation $PT Eval Moderate Complexity: 1 Procedure     PT G CodesFabio Asa 12-26-15, 10:23 AM Charlotte Crumb, PT DPT  [phone]

## 2015-11-27 NOTE — Progress Notes (Signed)
Patient remains tachycardic without relief from pain medicine. Labs have been drawn, awaiting results. MD made aware. Will continue to monitor. Ripley FraiseHarper, Gregory Dowe D

## 2015-11-27 NOTE — Care Management Note (Signed)
Case Management Note  Patient Details  Name: Kathleen SpearingLillie M Slane MRN: [id number] Date of Birth: 09/09/1950  Subjective/Objective:   Pt admitted on 11/26/15 s/p L2-3, L3-4, L4-5 posterior lumbar interbody fusion.  PTA, pt resides at home alone; uses cane for mobility.                   Action/Plan: PT/OT recommending SNF for rehab; CSW following for SNF placement upon dc.  Will follow/assist with discharge planning as pt progresses.    Expected Discharge Date:                  Expected Discharge Plan:  Skilled Nursing Facility  In-House Referral:  Clinical Social Work  Discharge planning Services  CM Consult  Post Acute Care Choice:    Choice offered to:     DME Arranged:    DME Agency:     HH Arranged:    HH Agency:     Status of Service:  In process, will continue to follow  If discussed at Long Length of Stay Meetings, dates discussed:    Additional Comments:  Quintella BatonJulie W. Lyan Holck, RN, BSN  Trauma/Neuro ICU Case Manager [phone]

## 2015-11-27 NOTE — Progress Notes (Signed)
Patient's heart rate remains 110-120. Patient repositioned and 50mcg Fentanyl given. MD notified. New orders given. Will continue to monitor. Ripley FraiseHarper, Jahziah Simonin D

## 2015-11-27 NOTE — Clinical Social Work Placement (Signed)
   CLINICAL SOCIAL WORK PLACEMENT  NOTE  Date:  11/27/2015  Patient Details  Name: Kathleen SpearingLillie M Rittenhouse MRN: [id number] Date of Birth: 24-Jun-1950  Clinical Social Work is seeking post-discharge placement for this patient at the Skilled  Nursing Facility level of care (*CSW will initial, date and re-position this form in  chart as items are completed):  Yes   Patient/family provided with [facility] Clinical Social Work Department's list of facilities offering this level of care within the geographic area requested by the patient (or if unable, by the patient's family).  Yes   Patient/family informed of their freedom to choose among providers that offer the needed level of care, that participate in Medicare, Medicaid or managed care program needed by the patient, have an available bed and are willing to accept the patient.  Yes   Patient/family informed of [facility] ownership interest [facility] and [facility], as well as of the fact that they are under no obligation to receive care at these facilities.  PASRR submitted to EDS on 11/27/15     PASRR number received on 11/27/15     Existing PASRR number confirmed on       FL2 transmitted to all facilities in geographic area requested by pt/family on 11/27/15     FL2 transmitted to all facilities within larger geographic area on 11/27/15     Patient informed that his/her managed care company has contracts with or will negotiate with certain facilities, including the following:            Patient/family informed of bed offers received.  Patient chooses bed at       Physician recommends and patient chooses bed at      Patient to be transferred to   on  .  Patient to be transferred to facility by       Patient family notified on   of transfer.  Name of family member notified:        PHYSICIAN Please sign FL2     Additional Comment:    _______________________________________________ Rockwell GermanyAshley N Gardner,  LCSW 11/27/2015, 11:37 AM

## 2015-11-27 NOTE — Progress Notes (Signed)
eLink Physician-Brief Progress Note Patient Name: Kathleen SpearingLillie M Bishop DOB: [birth year] MRN: [id number]   Date of Service  11/27/2015  HPI/Events of Note  Oliguria.  eICU Interventions  Will order: 1. Bolus with 0.9 NaCl 1 liter IV over 1 hour now.     Intervention Category Intermediate Interventions: Oliguria - evaluation and management  Harlyn Rathmann Eugene 11/27/2015, 12:25 AM

## 2015-11-27 NOTE — Progress Notes (Signed)
Patient ID: Kathleen Bishop, female   DOB: 1951/02/13, 65 y.o.   MRN: [id number] Vital signs are stable Motor function appears intact Patient's urine output has been marginal Difficult blood stick CBC and be met for today still pending Continue to observe in ICU

## 2015-11-28 LAB — BASIC METABOLIC PANEL
ANION GAP: 7 (ref 5–15)
ANION GAP: 6 (ref 5–15)
BUN: 13 mg/dL (ref 6–20)
BUN: 10 mg/dL (ref 6–20)
CHLORIDE: 108 mmol/L (ref 101–111)
CO2: 22 mmol/L (ref 22–32)
CO2: 25 mmol/L (ref 22–32)
Calcium: 7.9 mg/dL — ABNORMAL LOW (ref 8.9–10.3)
Calcium: 8.3 mg/dL — ABNORMAL LOW (ref 8.9–10.3)
Chloride: 105 mmol/L (ref 101–111)
Creatinine, Ser: 1.51 mg/dL — ABNORMAL HIGH (ref 0.44–1.00)
Creatinine, Ser: 1.35 mg/dL — ABNORMAL HIGH (ref 0.44–1.00)
GFR calc non Af Amer: 41 mL/min — ABNORMAL LOW (ref >60)
GFR, EST AFRICAN AMERICAN: 42 mL/min — AB (ref >60)
GFR, EST AFRICAN AMERICAN: 48 mL/min — AB (ref >60)
GFR, EST NON AFRICAN AMERICAN: 36 mL/min — AB (ref >60)
Glucose, Bld: 127 mg/dL — ABNORMAL HIGH (ref 65–99)
Glucose, Bld: 129 mg/dL — ABNORMAL HIGH (ref 65–99)
POTASSIUM: 3.6 mmol/L (ref 3.5–5.1)
POTASSIUM: 3.6 mmol/L (ref 3.5–5.1)
SODIUM: 137 mmol/L (ref 135–145)
Sodium: 136 mmol/L (ref 135–145)

## 2015-11-28 LAB — CBC WITH DIFFERENTIAL/PLATELET
BASOS ABS: 0 10*3/uL (ref 0.0–0.1)
BASOS PCT: 0 %
EOS ABS: 0 10*3/uL (ref 0.0–0.7)
EOS PCT: 0 %
HCT: 23.7 % — ABNORMAL LOW (ref 36.0–46.0)
HEMOGLOBIN: 7.8 g/dL — AB (ref 12.0–15.0)
Lymphocytes Relative: 13 %
Lymphs Abs: 1.3 10*3/uL (ref 0.7–4.0)
MCH: 29.9 pg (ref 26.0–34.0)
MCHC: 32.9 g/dL (ref 30.0–36.0)
MCV: 90.8 fL (ref 78.0–100.0)
Monocytes Absolute: 0.7 10*3/uL (ref 0.1–1.0)
Monocytes Relative: 7 %
NEUTROS PCT: 80 %
Neutro Abs: 8.2 10*3/uL — ABNORMAL HIGH (ref 1.7–7.7)
PLATELETS: 142 10*3/uL — AB (ref 150–400)
RBC: 2.61 MIL/uL — AB (ref 3.87–5.11)
RDW: 13.6 % (ref 11.5–15.5)
WBC: 10.3 10*3/uL (ref 4.0–10.5)

## 2015-11-28 LAB — CBC
HEMATOCRIT: 23.2 % — AB (ref 36.0–46.0)
Hemoglobin: 7.6 g/dL — ABNORMAL LOW (ref 12.0–15.0)
MCH: 29.7 pg (ref 26.0–34.0)
MCHC: 32.8 g/dL (ref 30.0–36.0)
MCV: 90.6 fL (ref 78.0–100.0)
Platelets: 151 10*3/uL (ref 150–400)
RBC: 2.56 MIL/uL — AB (ref 3.87–5.11)
RDW: 13.5 % (ref 11.5–15.5)
WBC: 9.5 10*3/uL (ref 4.0–10.5)

## 2015-11-28 LAB — GLUCOSE, CAPILLARY
GLUCOSE-CAPILLARY: 134 mg/dL — AB (ref 65–99)
GLUCOSE-CAPILLARY: 132 mg/dL — AB (ref 65–99)
Glucose-Capillary: 103 mg/dL — ABNORMAL HIGH (ref 65–99)
Glucose-Capillary: 133 mg/dL — ABNORMAL HIGH (ref 65–99)
Glucose-Capillary: 139 mg/dL — ABNORMAL HIGH (ref 65–99)
Glucose-Capillary: 135 mg/dL — ABNORMAL HIGH (ref 65–99)

## 2015-11-28 LAB — PROCALCITONIN: Procalcitonin: 0.69 ng/mL

## 2015-11-28 LAB — MAGNESIUM: MAGNESIUM: 1.5 mg/dL — AB (ref 1.7–2.4)

## 2015-11-28 LAB — BLOOD GAS, ARTERIAL
Acid-base deficit: 0.4 mmol/L (ref 0.0–2.0)
Bicarbonate: 23.9 mmol/L (ref 20.0–28.0)
Drawn by: 244861
O2 Content: 2 L/min
O2 Saturation: 98.2 %
PATIENT TEMPERATURE: 98.6
PCO2 ART: 39.9 mmHg (ref 32.0–48.0)
PO2 ART: 101 mmHg (ref 83.0–108.0)
pH, Arterial: 7.395 (ref 7.350–7.450)

## 2015-11-28 LAB — PHOSPHORUS: PHOSPHORUS: 2.6 mg/dL (ref 2.5–4.6)

## 2015-11-28 LAB — TROPONIN I
Troponin I: 0.07 ng/mL (ref <0.03)
Troponin I: 0.04 ng/mL (ref <0.03)

## 2015-11-28 LAB — LACTIC ACID, PLASMA: LACTIC ACID, VENOUS: 1 mmol/L (ref 0.5–1.9)

## 2015-11-28 MED ORDER — MAGNESIUM SULFATE 2 GM/50ML IV SOLN
2.0000 g | Freq: Once | INTRAVENOUS | Status: AC
  Administered 2015-11-28: 2 g via INTRAVENOUS
  Filled 2015-11-28: qty 50

## 2015-11-28 MED ORDER — SODIUM CHLORIDE 0.9% FLUSH
10.0000 mL | INTRAVENOUS | Status: DC | PRN
  Administered 2015-12-04: 10 mL
  Filled 2015-11-28: qty 40

## 2015-11-28 MED ORDER — NALOXONE HCL 0.4 MG/ML IJ SOLN: 0.4000 mg | INTRAMUSCULAR | Status: DC | PRN

## 2015-11-28 MED ORDER — DIPHENHYDRAMINE HCL 12.5 MG/5ML PO ELIX: 12.5000 mg | ORAL_SOLUTION | Freq: Four times a day (QID) | ORAL | Status: DC | PRN

## 2015-11-28 MED ORDER — ONDANSETRON HCL 4 MG/2ML IJ SOLN: 4.0000 mg | Freq: Four times a day (QID) | INTRAMUSCULAR | Status: DC | PRN

## 2015-11-28 MED ORDER — SODIUM CHLORIDE 0.9% FLUSH: 9.0000 mL | INTRAVENOUS | Status: DC | PRN

## 2015-11-28 MED ORDER — DIPHENHYDRAMINE HCL 50 MG/ML IJ SOLN: 12.5000 mg | Freq: Four times a day (QID) | INTRAMUSCULAR | Status: DC | PRN

## 2015-11-28 MED ORDER — FENTANYL 40 MCG/ML IV SOLN
INTRAVENOUS | Status: DC
  Administered 2015-11-28: 105 ug via INTRAVENOUS
  Administered 2015-11-28: 14:00:00 via INTRAVENOUS
  Administered 2015-11-28: 40 ug via INTRAVENOUS
  Administered 2015-11-29: 135 ug via INTRAVENOUS
  Administered 2015-11-29 (×2): 120 ug via INTRAVENOUS
  Administered 2015-11-29: 60 ug via INTRAVENOUS
  Administered 2015-11-29: 105 ug via INTRAVENOUS
  Administered 2015-11-30: 45 ug via INTRAVENOUS
  Administered 2015-11-30 (×2): 75 ug via INTRAVENOUS
  Filled 2015-11-28 (×2): qty 25

## 2015-11-28 MED ORDER — SODIUM CHLORIDE 0.9% FLUSH
10.0000 mL | Freq: Two times a day (BID) | INTRAVENOUS | Status: DC
  Administered 2015-11-28 – 2015-11-29 (×3): 10 mL

## 2015-11-28 NOTE — Progress Notes (Signed)
Pt's end tidal CO2 is 75. (normal limit is 35-45.) Good waveform. Pt is drowsy. MD Ramaswami notified, orders received.

## 2015-11-28 NOTE — Progress Notes (Signed)
Normal a gas results (PCO2 39.9). Discussed with E link MD. Ch[facility] ETCO2 monitoring [facility] to the portable monitor along side the PCA pump. ETCO2 40, good waveform. Will continue to monitor ETCO2 on pump monitor.

## 2015-11-28 NOTE — Progress Notes (Signed)
Peripherally Inserted Central Catheter/Midline Placement  The IV Nurse has discussed with the patient and/or persons authorized to consent for the patient, the purpose of this procedure and the potential benefits and risks involved with this procedure.  The benefits include less needle sticks, lab draws from the catheter and patient may be discharged home with the catheter.  Risks include, but not limited to, infection, bleeding, blood clot (thrombus formation), and puncture of an artery; nerve damage and irregular heat beat.  Alternatives to this procedure were also discussed.  PICC/Midline Placement Documentation        Maximino GreenlandLumban, Naelani Lafrance Albarece 11/28/2015, 3:04 PM

## 2015-11-28 NOTE — Progress Notes (Signed)
Physical Therapy Treatment Patient Details Name: Kathleen SpearingLillie M Bishop MRN: [id number] DOB: 1951-02-18 Today's Date: 11/28/2015    History of Present Illness 65 year old female past medical history significant for diabetes, hypertension, hyperlipidemia, and chronic back pain, HA, and Bil THA.  Pt now s/p L2-5 decompression and fusion.      PT Comments    Pt was able to get OOB to chair and take a few pivotal steps with RW and two person assist.  She did well enough today that I anticipate she can progress to gait with chair to follow next session.  I continue to agree with SNF for rehab at discharge.    Follow Up Recommendations  SNF;Supervision/Assistance - 24 hour     Equipment Recommendations  3in1 (PT)    Recommendations for Other Services   NA     Precautions / Restrictions Precautions Precautions: Fall;Back Required Braces or Orthoses: Spinal Brace Spinal Brace: Lumbar corset    Mobility  Bed Mobility Overal bed mobility: Needs Assistance;+2 for physical assistance Bed Mobility: Rolling;Sidelying to Sit Rolling: +2 for physical assistance;Mod assist Sidelying to sit: +2 for physical assistance;Mod assist       General bed mobility comments: Two person assist to help pt position with knees flexed and roll reaching to each side of the bed rail.  Pt rolled bil for peri care and bed linen change.  From sidelying pt needed assist to progress legs over EOB and support was provided at trunk to boost up to sitting EOB.   Transfers Overall transfer level: Needs assistance Equipment used: Rolling walker (2 wheeled) Transfers: Sit to/from [employer] CorporationStand;Stand Pivot Transfers Sit to Stand: +2 safety/equipment;Mod assist Stand pivot transfers: +2 safety/equipment;Mod assist       General transfer comment: Two person mod assist to get to standing from EOB.  Assist provided at trunk to power up to stand over weak legs.   Ambulation/Gait             General Gait Details: Pt will  likely be ready to progress gait with chair to follow next session.                       Balance Overall balance assessment: Needs assistance Sitting-balance support: Feet supported;Bilateral upper extremity supported Sitting balance-Leahy Scale: Fair     Standing balance support: Bilateral upper extremity supported Standing balance-Leahy Scale: Poor                      Cognition Arousal/Alertness: Awake/alert Behavior During Therapy: WFL for tasks assessed/performed Overall Cognitive Status: Within Functional Limits for tasks assessed                      Exercises      General Comments        Pertinent Vitals/Pain Pain Assessment: 0-10 Pain Score: 8  Pain Location: incisional Pain Descriptors / Indicators: Aching;Burning Pain Intervention(s): Limited activity within patient's tolerance;Monitored during session;Repositioned;PCA encouraged           PT Goals (current goals can now be found in the care plan section) Acute Rehab PT Goals Patient Stated Goal: rehab before home Progress towards PT goals: Progressing toward goals    Frequency  Min 5X/week    PT Plan Current plan remains appropriate    Co-evaluation             End of Session Equipment Utilized During Treatment: Back brace Activity Tolerance: Patient limited by pain Patient  left: in chair;with call bell/phone within reach;with chair alarm set     Time: [phone] PT Time Calculation (min) (ACUTE ONLY): 19 min  Charges:  $Therapeutic Activity: 8-22 mins                      Terasa Orsini B. Keviana Guida, PT, DPT [phone]   11/28/2015, 10:11 PM

## 2015-11-28 NOTE — Consult Note (Signed)
PULMONARY / CRITICAL CARE MEDICINE   Name: Kathleen Bishop MRN: [id number] DOB: 16-Apr-1950    ADMISSION DATE:  11/26/2015 CONSULTATION DATE:  8/29  REFERRING MD:  Danielle Dess  CHIEF COMPLAINT:  Post op evaluation  BRIEF 65 year old female past medical history as below which significant for diabetes, hypertension, hyperlipidemia, and chronic back pain. She hasn't had problems with back and bilateral leg pain for nearly 2 years secondary to a work-related incident. She was initially treated conservatively but pain continued to progress over time. 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 Despite continued conservative management pain has been progressive. Activities of daily living have been limited. She sought surgical evaluation by Dr. Danielle Dess, and presented to [facility] A/[facility] for elective laminectomy L2-L3 and L4 with decompression L2-L5. Posterior lumbar interbody arthrodesis, segmental fixation L2 L5 and posterior lateral arthrodesis L2-L5. Surgery was complicated by development of hematuria followed by poor urine output. She also developed rash during the case, which resolved with the administration of Benadryl. She was successfully extubated postoperatively and transferred to the ICU for monitoring. Estimated blood loss for the procedure is 750 mL's, 200 of which was returned to the patient via Cell Saver. PCCM has been consulted.   STUDIES:    CULTURES:   ANTIBIOTICS: Bactrim and Ancef perioperative   LINES/TUBES: Foley 8/29 >> PIV x1   SIGNIFICANT EVENTS: 8/29 - Admit w/ L-spine surgery  11/27/15 - RN says pre-op and post op hgb shows > 5gm% drop. Currently very difficult IV stick. Making very little urine only. Not intubated. Noted to be on oxy, lisinopril and toraldaol. Patient asking for lot of pain med per RN   SUBJECTIVE/OVERNIGHT/INTERVAL HX 11/28/15 - HR > 100 since 1pm yesterday; correlates with fever. Neuurosrgry wondering about  PRBC. Making urine after fluids; AKI improved > Still with lot of pain issues but Nsgy notes ndicate motor functon intact. No active bleeding  VITAL SIGNS: BP 108/62 (BP Location: Right Arm)   Pulse (!) 103   Temp 99.1 F (37.3 C) (Oral)   Resp 18   Ht 5\' 5"  (1.651 m)   Wt 112.5 kg (248 lb)   SpO2 100%   BMI 41.27 kg/m   INTAKE / OUTPUT: I/O last 3 completed shifts: In: 3428.3 [I.V.:3428.3] Out: 2775 [Urine:2775]  PHYSICAL EXAMINATION: General:  Obese female in no acute distress. LOOKS DRY but better Neuro:  More arousable arouses, alert oriented/ c/o significant pain and poor control HEENT:  /AT, PERRL, no appreciable JVD Cardiovascular:  RRR, no MRG. TACHY +_ SINUS +  Lungs:  Clear Abdomen:  Soft, non-tender, non-distended. Hyperactive BS Musculoskeletal:  No acute deformity or ROM limitation. +sensation and movement bilateral feet Skin:  Grossly intact, did not assess surgical site.  LABS: PULMONARY No results for input(s): PHART, PCO2ART, PO2ART, HCO3, TCO2, O2SAT in the last 168 hours.  Invalid input(s): PCO2, PO2  CBC  Recent Labs Lab 11/27/15 0756 11/27/15 2105 11/28/15 0547  HGB 8.9* 8.2* 7.8*  HCT 27.5* 25.2* 23.7*  WBC 9.2 9.9 10.3  PLT 175 134* 142*    COAGULATION  Recent Labs Lab 11/27/15 0756  INR 1.15    CARDIAC    Recent Labs Lab 11/28/15 0547  TROPONINI 0.07*   No results for input(s): PROBNP in the last 168 hours.   CHEMISTRY  Recent Labs Lab 11/27/15 0546 11/27/15 1556 11/28/15 0547  NA 137 135 137  K 5.2* 4.2 3.6  CL 106 108 108  CO2 22 22  22  GLUCOSE 145* 144* 127*  BUN 16 17 13   CREATININE 1.60* 1.59* 1.51*  CALCIUM 8.1* 7.7* 7.9*  MG  --   --  1.5*  PHOS  --   --  2.6   Estimated Creatinine Clearance: 48.3 mL/min (by C-G formula based on SCr of 1.51 mg/dL).   LIVER  Recent Labs Lab 11/27/15 0756  INR 1.15     INFECTIOUS  Recent Labs Lab 11/27/15 1556 11/28/15 0547  LATICACIDVEN 0.8 1.0      ENDOCRINE CBG (last 3)   Recent Labs  11/27/15 2333 11/28/15 0327 11/28/15 0807  GLUCAP 108* 134* 103*         IMAGING x48h  - image(s) personally visualized  -   highlighted in bold Dg Lumbar Spine 2-3 Views  Result Date: 11/26/2015 CLINICAL DATA:  65 y/o F; posterior lumbar instrumented fusion of L2 through L5. EXAM: LUMBAR SPINE - 2-3 VIEW COMPARISON:  Intraoperative fluoroscopy 11/26/2015 FINDINGS: Posterior instrumented fusion hardware with transpedicle screws and vertical rods as well as an interbody fusion cages are present. Hardware appears intact. Status post laminectomy of fused vertebral bodies. IMPRESSION: Posterior instrumented fusion hardware appears intact. Electronically Signed   By: Mitzi HansenLance  Furusawa-Stratton M.D.   On: 11/26/2015 14:13   Dg C-arm 1-60 Min  Result Date: 11/26/2015 CLINICAL DATA:  PLIF of the L2-3, L3-4 and L4-5 EXAM: DG C-ARM 61-120 MIN COMPARISON:  None. FINDINGS: AP and lateral intraoperative fluoroscopic spot images show surgical changes of central canal decompression and posterior intrapedicular fixation hardware at the L2 through L5 levels, appropriately positioned, with interposed disc spacers/cages at each level. IMPRESSION: Intraoperative fluoroscopic spot images showing no evidence of surgical complicating feature. Electronically Signed   By: Bary RichardStan  Maynard M.D.   On: 11/26/2015 14:24     DISCUSSION: 65 year old who presented 8/29 for elective L-spine surgery. Perioperative course complicated by rash treated with benadryl. 750mL blood loss 200 Cell Saver. Also developed hematuria during case, and has since had poor UOP. She was extubated and was HD stable immediately post-op. PCCM consulted.  ASSESSMENT / PLAN:  PULMONARY A: No acute issues  P:   Monitor O2 sats  CARDIOVASCULAR A:  HTN, HLD   - mil trop bump - not indicative of MI. TOUgh IV stick. SIRS wth fever and tachycardia  P:  Contirole fever  PICC line ordered due  to tough IV stick Check ECHO for completements Cycle trop Hold home  lisinopril and simvastatin due to anuria 11/27/15 - restart later Telemetry monitoring   RENAL A:   Hematuria Poor urine output - AKI post op volume loss related   - improving  AKI - probabluy related to volume loss. Low mag +  P:   Replete mag continue fluid at 100cc/h Recheck bmet 3pm Hold metformin Dc lisinopril for now Dc zocor DC oxycodone DC toradol  GASTROINTESTINAL A:   No acute issues  P:   Diet per neurosurgery  HEMATOLOGIC A:   Hematuria Possible ABLA   - Anemia post op - clinical profile 8/31/ no longer due to blood loss but due to anemia of critical illness  P:  Transfuse for hemoglobin < 7 if symptomatic or actively bleeding or in MI   INFECTIOUS A:   Has new fever with associated tachycardia - SIRS  - s/p Perioperative ancef, bactrim per neurosurgery  P:   Blood culture x 2 Urine culture PCT/Lactate   ENDOCRINE A:   DM  P:   CBG monitoring and  SSI Holding Metformin  NEUROLOGIC A:   Chronic back pain s/p laminectomy/decompression/fixation L2-L5   - now with acute post op and chronic back pain; poor control per patient 11/28/15  P:   Pain management -change to fentanyl PCA Monitor   FAMILY  - Updates: Patient updated bedside 8/29.  Updated patient at [place] and d/w Dr Danielle Dess 11/28/15   - Inter-disciplinary family meet or Palliative Care meeting due by:  9/5  GLOBAL 11/28/15 - SIRS +; tachy likely due to fever as opposed to anemia. Will check cultures and PCT. Start abx if PCT high. RN advised fever control as fiorst step./ Will get PICC line . Change pain control to PCA    Dr. Kalman Shan, M.D., [facility].C.P Pulmonary and Critical Care Medicine Staff Physician [facility] [place] Pulmonary and Critical Care Pager: [phone], If no answer or between  15:00h - 7:00h: call [phone]  11/28/2015 9:37 AM

## 2015-11-28 NOTE — Progress Notes (Signed)
Patient ID: [name], female   DOB: 03-Apr-1950, 65 y.o.   MRN: [id number] Vital signs are stable Hemoglobin has continued to decrease to 7.8 I will defer transfusion to critical care service Motor function appears be intact Pain controls modest Continues to progress with recovery

## 2015-11-29 ENCOUNTER — Inpatient Hospital Stay (HOSPITAL_COMMUNITY): Payer: Worker's Compensation

## 2015-11-29 ENCOUNTER — Other Ambulatory Visit (HOSPITAL_COMMUNITY): Payer: Self-pay

## 2015-11-29 DIAGNOSIS — R079 Chest pain, unspecified: Secondary | ICD-10-CM

## 2015-11-29 LAB — CBC WITH DIFFERENTIAL/PLATELET
BASOS ABS: 0 10*3/uL (ref 0.0–0.1)
BASOS PCT: 0 %
EOS PCT: 0 %
Eosinophils Absolute: 0 10*3/uL (ref 0.0–0.7)
HCT: 21.6 % — ABNORMAL LOW (ref 36.0–46.0)
Hemoglobin: 7 g/dL — ABNORMAL LOW (ref 12.0–15.0)
LYMPHS PCT: 14 %
Lymphs Abs: 1.4 10*3/uL (ref 0.7–4.0)
MCH: 29.5 pg (ref 26.0–34.0)
MCHC: 32.4 g/dL (ref 30.0–36.0)
MCV: 91.1 fL (ref 78.0–100.0)
Monocytes Absolute: 0.9 10*3/uL (ref 0.1–1.0)
Monocytes Relative: 9 %
NEUTROS ABS: 7.5 10*3/uL (ref 1.7–7.7)
Neutrophils Relative %: 77 %
PLATELETS: 137 10*3/uL — AB (ref 150–400)
RBC: 2.37 MIL/uL — AB (ref 3.87–5.11)
RDW: 13.3 % (ref 11.5–15.5)
WBC: 9.9 10*3/uL (ref 4.0–10.5)

## 2015-11-29 LAB — ECHOCARDIOGRAM COMPLETE
AVLVOTPG: 10 mmHg
Ao-asc: 30 cm
CHL CUP DOP CALC LVOT VTI: 25.5 cm
CHL CUP MV DEC (S): 144
E decel time: 144 msec
EERAT: 10.69
FS: 46 % — AB (ref 28–44)
HEIGHTINCHES: 65 in
IV/PV OW: 1.1
LA diam end sys: 27 mm
LA diam index: 1.24 cm/m2
LA vol A4C: 35.7 ml
LA vol: 46.2 mL
LASIZE: 27 mm
LAVOLIN: 21.3 mL/m2
LDCA: 2.54 cm2
LV TDI E'LATERAL: 10.2
LV TDI E'MEDIAL: 8.92
LVEEAVG: 10.69
LVEEMED: 10.69
LVELAT: 10.2 cm/s
LVOT SV: 65 mL
LVOT diameter: 18 mm
LVOTPV: 159 cm/s
MV pk A vel: 70 m/s
MV pk E vel: 109 m/s
MVPG: 5 mmHg
PW: 10 mm — AB (ref 0.6–1.1)
TAPSE: 26.1 mm
WEIGHTICAEL: 3968 [oz_av]

## 2015-11-29 LAB — BASIC METABOLIC PANEL
ANION GAP: 8 (ref 5–15)
BUN: 9 mg/dL (ref 6–20)
CO2: 26 mmol/L (ref 22–32)
Calcium: 8.1 mg/dL — ABNORMAL LOW (ref 8.9–10.3)
Chloride: 103 mmol/L (ref 101–111)
Creatinine, Ser: 1.14 mg/dL — ABNORMAL HIGH (ref 0.44–1.00)
GFR, EST AFRICAN AMERICAN: 58 mL/min — AB (ref >60)
GFR, EST NON AFRICAN AMERICAN: 50 mL/min — AB (ref >60)
GLUCOSE: 121 mg/dL — AB (ref 65–99)
POTASSIUM: 3.6 mmol/L (ref 3.5–5.1)
Sodium: 137 mmol/L (ref 135–145)

## 2015-11-29 LAB — GLUCOSE, CAPILLARY
GLUCOSE-CAPILLARY: 126 mg/dL — AB (ref 65–99)
Glucose-Capillary: 110 mg/dL — ABNORMAL HIGH (ref 65–99)
Glucose-Capillary: 112 mg/dL — ABNORMAL HIGH (ref 65–99)
Glucose-Capillary: 115 mg/dL — ABNORMAL HIGH (ref 65–99)
Glucose-Capillary: 135 mg/dL — ABNORMAL HIGH (ref 65–99)
Glucose-Capillary: 149 mg/dL — ABNORMAL HIGH (ref 65–99)

## 2015-11-29 LAB — CBC
HCT: 25.3 % — ABNORMAL LOW (ref 36.0–46.0)
Hemoglobin: 8.1 g/dL — ABNORMAL LOW (ref 12.0–15.0)
MCH: 29.1 pg (ref 26.0–34.0)
MCHC: 32 g/dL (ref 30.0–36.0)
MCV: 91 fL (ref 78.0–100.0)
PLATELETS: 129 10*3/uL — AB (ref 150–400)
RBC: 2.78 MIL/uL — AB (ref 3.87–5.11)
RDW: 13.1 % (ref 11.5–15.5)
WBC: 8.2 10*3/uL (ref 4.0–10.5)

## 2015-11-29 LAB — TROPONIN I: TROPONIN I: 0.04 ng/mL — AB (ref <0.03)

## 2015-11-29 LAB — MAGNESIUM: MAGNESIUM: 2.2 mg/dL (ref 1.7–2.4)

## 2015-11-29 LAB — PROCALCITONIN: PROCALCITONIN: 0.59 ng/mL

## 2015-11-29 LAB — PHOSPHORUS: PHOSPHORUS: 2.4 mg/dL — AB (ref 2.5–4.6)

## 2015-11-29 MED ORDER — POTASSIUM PHOSPHATES 15 MMOLE/5ML IV SOLN
10.0000 mmol | Freq: Once | INTRAVENOUS | Status: AC
  Administered 2015-11-29: 10 mmol via INTRAVENOUS
  Filled 2015-11-29: qty 3.33

## 2015-11-29 MED ORDER — DEXTROSE-NACL 5-0.45 % IV SOLN
INTRAVENOUS | Status: DC
  Administered 2015-11-29: 12:00:00 via INTRAVENOUS

## 2015-11-29 MED ORDER — PANTOPRAZOLE SODIUM 20 MG PO TBEC
20.0000 mg | DELAYED_RELEASE_TABLET | Freq: Every day | ORAL | Status: DC
  Administered 2015-11-29 – 2015-12-04 (×6): 20 mg via ORAL
  Filled 2015-11-29 (×7): qty 1

## 2015-11-29 NOTE — Progress Notes (Signed)
Occupational Therapy Treatment Patient Details Name: Kathleen Bishop MRN: [id number] DOB: 11-Oct-1950 Today's Date: 11/29/2015    History of present illness 65 year old female past medical history significant for diabetes, hypertension, hyperlipidemia, and chronic back pain, HA, and Bil THA.  Pt now s/p L2-5 decompression and fusion.     OT comments  Pt making progress although appears limited by pain. VSS.  Seen as cotreat with PT and ambulated with min A +2 for safety/equipment management. Max A with LB ADL. Continue to recommend SNF for rehab. Will continue to follow acutely.   Follow Up Recommendations  SNF;Supervision/Assistance - 24 hour    Equipment Recommendations  Other (comment)    Recommendations for Other Services      Precautions / Restrictions Precautions Precautions: Fall;Back Precaution Booklet Issued: Yes (comment) Precaution Comments: verbally reviewed and provided hand out to patient Required Braces or Orthoses: Spinal Brace Spinal Brace: Lumbar corset Restrictions Weight Bearing Restrictions: No       Mobility Bed Mobility               General bed mobility comments: received in chair  Transfers Overall transfer level: Needs assistance Equipment used: Rolling walker (2 wheeled) Transfers: Sit to/from Stand Sit to Stand: +2 safety/equipment;Mod assist Stand pivot transfers: +2 safety/equipment;Mod assist       General transfer comment:  cues for hand placement and positioning, increased assist to power to standing from chair as well as BSC. Pivot from [facility] to chair performed with cues for safety prior to descending to chair    Balance Overall balance assessment: Needs assistance Sitting-balance support: Feet supported Sitting balance-Leahy Scale: Fair     Standing balance support: Bilateral upper extremity supported Standing balance-Leahy Scale: Poor Standing balance comment: reliance on UE support                   ADL Overall  ADL's : Needs assistance/impaired                     Lower Body Dressing: Maximal assistance   Toilet Transfer: RW;+2 for safety/equipment;Minimal assistance   Toileting- Clothing Manipulation and Hygiene: Maximal assistance;Sit to/from stand       Functional mobility during ADLs: Minimal assistance;+2 for safety/equipment;Rolling walker;Cueing for safety;Cueing for sequencing        Vision                     Perception     Praxis      Cognition   Behavior During Therapy: Flat affect Overall Cognitive Status: Within Functional Limits for tasks assessed                       Extremity/Trunk Assessment               Exercises     Shoulder Instructions       General Comments      Pertinent Vitals/ Pain       Pain Assessment: 0-10 Pain Score: 8  Pain Location: back Pain Descriptors / Indicators: Aching;Sore Pain Intervention(s): Limited activity within patient's tolerance  Home Living                                          Prior Functioning/Environment              Frequency Min 2X/week  Progress Toward Goals  OT Goals(current goals can now be found in the care plan section)  Progress towards OT goals: Progressing toward goals  Acute Rehab OT Goals Patient Stated Goal: rehab before home OT Goal Formulation: With patient Time For Goal Achievement: 12/11/15 Potential to Achieve Goals: Good ADL Goals Pt Will Perform Grooming: with min guard assist;standing Pt Will Perform Lower Body Bathing: with min guard assist;sit to/from stand Pt Will Perform Lower Body Dressing: with min guard assist;sit to/from stand Pt Will Transfer to Toilet: with min guard assist;ambulating;bedside commode Pt Will Perform Toileting - Clothing Manipulation and hygiene: with min guard assist;sit to/from stand Additional ADL Goal #1: Pt will independently verbally recall 3/3 back precautions and maintain throughout  ADL. Additional ADL Goal #2: Pt will don/doff back brace with set up as precursor to ADL and functional mobility.  Plan Discharge plan remains appropriate    Co-evaluation    PT/OT/SLP Co-Evaluation/Treatment: Yes Reason for Co-Treatment: For patient/therapist safety PT goals addressed during session: Mobility/safety with mobility OT goals addressed during session: ADL's and self-care      End of Session Equipment Utilized During Treatment: Gait belt;Rolling walker;Back brace   Activity Tolerance Patient tolerated treatment well   Patient Left in chair;with call bell/phone within reach   Nurse Communication Mobility status        Time: [id number] OT Time Calculation (min): 28 min  Charges: OT General Charges $OT Visit: 1 Procedure OT Treatments $Self Care/Home Management : 8-22 mins  Alessa Mazur,HILLARY 11/29/2015, 6:02 PM   [facility] Erminie Foulks, OTR/L  [phone] 11/29/2015

## 2015-11-29 NOTE — Progress Notes (Addendum)
PULMONARY / CRITICAL CARE MEDICINE   Name: Kathleen Bishop MRN: [id number] DOB: March 09, 1951    ADMISSION DATE:  11/26/2015 CONSULTATION DATE:  8/29  REFERRING MD:  Danielle Dess  CHIEF COMPLAINT:  Post op evaluation  BRIEF 65 year old female past medical history as below which significant for diabetes, hypertension, hyperlipidemia, and chronic back pain. She hasn't had problems with back and bilateral leg pain for nearly 2 years secondary to a work-related incident. She was initially treated conservatively but pain continued to progress over time. 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 Despite continued conservative management pain has been progressive. Activities of daily living have been limited. She sought surgical evaluation by Dr. Danielle Dess, and presented to [facility] A/29 for elective laminectomy L2-L3 and L4 with decompression L2-L5. Posterior lumbar interbody arthrodesis, segmental fixation L2 L5 and posterior lateral arthrodesis L2-L5. Surgery was complicated by development of hematuria followed by poor urine output. She also developed rash during the case, which resolved with the administration of Benadryl. She was successfully extubated postoperatively and transferred to the ICU for monitoring. Estimated blood loss for the procedure is 750 mL's, 200 of which was returned to the patient via Cell Saver. PCCM has been consulted.   STUDIES:    CULTURES:   ANTIBIOTICS: Bactrim and Ancef perioperative   LINES/TUBES: Foley 8/29 >> Left PICC 8/31>>   SIGNIFICANT EVENTS: 8/29 - Admit w/ L-spine surgery  11/27/15 - RN says pre-op and post op hgb shows > 5gm% drop. Currently very difficult IV stick. Making very little urine only. Not intubated. Noted to be on oxy, lisinopril and toraldaol. Patient asking for lot of pain med per RN   SUBJECTIVE/OVERNIGHT/INTERVAL HX 11/28/15 - HR > 100 since 1pm yesterday; correlates with fever. Neuurosrgry  wondering about PRBC. Making urine after fluids; AKI improved > Still with lot of pain issues but Nsgy notes ndicate motor functon intact. No active bleeding  VITAL SIGNS: BP 103/60 (BP Location: Right Arm)   Pulse 96   Temp 99.9 F (37.7 C) (Oral)   Resp 19   Ht 5\' 5"  (1.651 m)   Wt 248 lb (112.5 kg)   SpO2 100%   BMI 41.27 kg/m   INTAKE / OUTPUT: I/O last 3 completed shifts: In: 4205 [P.O.:480; I.V.:3600; Other:75; IV Piggyback:50] Out: 5720 [Urine:5720]  PHYSICAL EXAMINATION: General:  Obese female in no acute distress. LOOKS DRY but better Neuro:  More arousable arouses, alert oriented/ c/o significant pain and poor control HEENT:  [place]/AT, PERRL, no appreciable JVD Cardiovascular:  RRR, no MRG. TACHY +_ SINUS +  Lungs:  Clear throughout Abdomen:  Soft, non-tender, non-distended. Hyperactive BS, eating Musculoskeletal:  No acute deformity or ROM limitation. +sensation and movement bilateral feet Skin:  Grossly intact, did not assess surgical site.  LABS: PULMONARY  Recent Labs Lab 11/28/15 1400  PHART 7.395  PCO2ART 39.9  PO2ART 101  HCO3 23.9  O2SAT 98.2    CBC  Recent Labs Lab 11/28/15 0547 11/28/15 1719 11/29/15 0540  HGB 7.8* 7.6* 7.0*  HCT 23.7* 23.2* 21.6*  WBC 10.3 9.5 9.9  PLT 142* 151 137*    COAGULATION  Recent Labs Lab 11/27/15 0756  INR 1.15    CARDIAC    Recent Labs Lab 11/28/15 0547 11/28/15 1050 11/29/15 0540  TROPONINI 0.07* 0.04* 0.04*   No results for input(s): PROBNP in the last 168 hours.   CHEMISTRY  Recent Labs Lab 11/27/15 0546 11/27/15 1556 11/28/15 0547 11/28/15 1719 11/29/15 0540  NA 137 135 137 136 137  K 5.2* 4.2 3.6 3.6 3.6  CL 106 108 108 105 103  CO2 22 22 22 25 26   GLUCOSE 145* 144* 127* 129* 121*  BUN 16 17 13 10 9   CREATININE 1.60* 1.59* 1.51* 1.35* 1.14*  CALCIUM 8.1* 7.7* 7.9* 8.3* 8.1*  MG  --   --  1.5*  --  2.2  PHOS  --   --  2.6  --  2.4*   Estimated Creatinine Clearance: 64  mL/min (by C-G formula based on SCr of 1.14 mg/dL).   LIVER  Recent Labs Lab 11/27/15 0756  INR 1.15     INFECTIOUS  Recent Labs Lab 11/27/15 1556 11/28/15 0547 11/28/15 1050 11/29/15 0540  LATICACIDVEN 0.8 1.0  --   --   PROCALCITON  --   --  0.69 0.59     ENDOCRINE CBG (last 3)   Recent Labs  11/28/15 2315 11/29/15 0317 11/29/15 0819  GLUCAP 135* 115* 112*         IMAGING x48h  - image(s) personally visualized  -   highlighted in bold No results found.   DISCUSSION: 65 year old who presented 8/29 for elective L-spine surgery. Perioperative course complicated by rash treated with benadryl. 750mL blood loss 200 Cell Saver. Also developed hematuria during case, and has since had poor UOP. She was extubated and was HD stable immediately post-op. PCCM consulted.  ASSESSMENT / PLAN:  PULMONARY A: No acute issues  P:   Monitor O2 sats  CARDIOVASCULAR A:  HTN, HLD   mil trop bump - not indicative of MI.  Tough IV stick. SIRS wth fever and tachycardia Blood Pressure remain soft  P:  Control fever PICC line placed 8/31 due to tough IV stick/ access issues  ECHO ordered and pending Cycle trop Hold home  lisinopril and simvastatin due to anuria 11/27/15 - restart later Telemetry monitoring   RENAL A:   Hematuria Poor urine output - AKI post op volume loss related   improving  AKI - probabluy related to volume loss. Hypomagnesemia>> Resolved P:   Continue fluid at 100cc/h Continue to hold metformin Dc lisinopril for now Dc zocor DC oxycodone DC toradol  GASTROINTESTINAL A:   No acute issues Tolerating diet well Plan: Add pantoprazole as prophylaxis  P:   Diet per neurosurgery  HEMATOLOGIC A:   Hematuria>> Resolved Possible ABLA Anemia post op - clinical profile 8/31/ no longer due to blood loss but due to anemia of critical illness 9/1: HGB drop to 7.0 No active bleeding  P:  Transfuse for hemoglobin < 7 if symptomatic or  actively bleeding or in MI Re check CBC this pm Trend CBC daily Monitor for obvious bleeding Hemoccult stool  INFECTIOUS A:   Has new fever with associated tachycardia - SIRS - s/p Perioperative ancef, bactrim per neurosurgery - lactate>> 1.0 -pct>> 0.59 Incision site CDI P:   8/31>>Blood culture x 2: >> 9/1>>Urine culture>>   ENDOCRINE A:   DM  P:   CBG monitoring and SSI Holding Metformin  NEUROLOGIC A:   Chronic back pain s/p laminectomy/decompression/fixation L2-L5  now with acute post op and chronic back pain; better control per patient 11/29/2015 with PCA. Pain score 6/10   P:   Pain management -tolerating   fentanyl PCA. Monitor   FAMILY  - Updates: Patient updated bedside 9/1.   - Inter-disciplinary family meet or Palliative Care meeting due by:  9/5  GLOBAL 11/29/15 - SIRS +;  tachy likely due to SIRS vs  anemia. Will continue to trend  cultures and PCT/ lactate. Start abx if PCT high. RN advised fever control as first step./  PICC line placed 8/31.  pain control better managed with  PCA Fentanyl  Bevelyn Ngo, AGACNP-BC [facility] Pulmonary/Critical Care Medicine Pager # [phone]  11/29/2015 9:29 AM   STAFF NOTE: I, Dr Lavinia Sharps have personally reviewed patient's available data, including medical history, events of note, physical examination and test results as part of my evaluation. I have discussed with resident/NP and other care providers such as pharmacist, RN and RRT.  In addition,  I personally evaluated patient and elicited key findings of   S:pain sopme better with PCA per patient. She is making good urine. hgb at 7gm% but no actively bleed. Fever coming down  O:  obese Moves all 4s Resting comfortably  HR mild tachycardia byt improved and around 100   Recent Labs Lab 11/28/15 0547 11/28/15 1719 11/29/15 0540  HGB 7.8* 7.6* 7.0*  HCT 23.7* 23.2* 21.6*  WBC 10.3 9.5 9.9  PLT 142* 151 137*    Recent Labs Lab 11/27/15 0546  11/27/15 1556 11/28/15 0547 11/28/15 1719 11/29/15 0540  NA 137 135 137 136 137  K 5.2* 4.2 3.6 3.6 3.6  CL 106 108 108 105 103  CO2 22 22 22 25 26   GLUCOSE 145* 144* 127* 129* 121*  BUN 16 17 13 10 9   CREATININE 1.60* 1.59* 1.51* 1.35* 1.14*  CALCIUM 8.1* 7.7* 7.9* 8.3* 8.1*  MG  --   --  1.5*  --  2.2  PHOS  --   --  2.6  --  2.4*     A:  Post op severe pain - continue PCA and slowy wean off. It is helping AKi volume loss - improved/resolved; reduce fluid to kvo d5 half. Ok to [employer] foley Low -phos - repleted Anemia -post op - > recheck cbc with trype and screen - PRBC if < 7gm%  OK to move to SDU - d/w Dr Sharon Seller who woll provide med consultation support from [facility] from 11/30/15 and PCCM opff     .  Rest per NP/medical resident whose note is outlined above and that I agree with   Dr. Kalman Shan, M.D., [facility].C.P Pulmonary and Critical Care Medicine Staff Physician [facility] Pulmonary and Critical Care Pager: [phone], If no answer or between  15:00h - 7:00h: call [phone]  11/29/2015 11:11 AM

## 2015-11-29 NOTE — Progress Notes (Signed)
Patient ID: Kathleen Bishop, female   DOB: 06/06/1950, 65 y.o.   MRN: [id number] Vital signs are stable however patient seems very somnolent. She is receiving PCA pump with fentanyl. Though this controls her pain she seems to be very little mobile and quite sedated. When she is alert she complains of anterior thigh pain. Her dressing remains clean and dry. Though she may be ready for stepdown prefer that she stay in the intensive care unit another day or 2 until her level of consciousness improved and hopefully her pain comes under better control.

## 2015-11-29 NOTE — Progress Notes (Signed)
Foley catheter was taken out per MD order. Pt. Tolerated well. Catheter intact. Educated pt. to call for help. Pt. Verbalized understanding. Pt. Up in chair. Aox4. Will continue to monitor.

## 2015-11-29 NOTE — Progress Notes (Signed)
  Echocardiogram 2D Echocardiogram has been performed.  Janalyn HarderWest, Kazoua Gossen R 11/29/2015, 2:45 PM

## 2015-11-29 NOTE — Progress Notes (Signed)
Physical Therapy Treatment Patient Details Name: Kathleen SpearingLillie M Doescher MRN: [id number] DOB: 1951/03/25 Today's Date: 11/29/2015    History of Present Illness 65 year old female past medical history significant for diabetes, hypertension, hyperlipidemia, and chronic back pain, HA, and Bil THA.  Pt now s/p L2-5 decompression and fusion.      PT Comments    Patient seen for mobility progression. Increased time and effort to perform mobility today. Patient was able to ambulate with assist, max multi modal cues for positioning, gait and safety. Assist for stability. Patient educated on spinal precautions with poor carry over at this time. Will continue to reinforce. Will see as indicated and progress as tolerated.  Follow Up Recommendations  SNF;Supervision/Assistance - 24 hour     Equipment Recommendations  3in1 (PT)    Recommendations for Other Services       Precautions / Restrictions Precautions Precautions: Fall;Back Precaution Booklet Issued: Yes (comment) Precaution Comments: verbally reviewed and provided hand out to patient Required Braces or Orthoses: Spinal Brace Spinal Brace: Lumbar corset Restrictions Weight Bearing Restrictions: No    Mobility  Bed Mobility               General bed mobility comments: received in chair  Transfers Overall transfer level: Needs assistance Equipment used: Rolling walker (2 wheeled) Transfers: Sit to/from Stand Sit to Stand: +2 safety/equipment;Mod assist Stand pivot transfers: +2 safety/equipment;Mod assist       General transfer comment: multi modal cues for hand placement and positioning, increased assist to power to standing from chair as well as BSC. Pivot from [facility] to chair performed with cues for safety prior to descending to chair  Ambulation/Gait Ambulation/Gait assistance: Min assist;+2 safety/equipment Ambulation Distance (Feet): 50 Feet Assistive device: Rolling walker (2 wheeled) Gait Pattern/deviations: Step-to  pattern;Decreased stride length;Shuffle;Drifts right/left;Trunk flexed (drift to the right, rotated gait) Gait velocity: decreased Gait velocity interpretation: <1.8 ft/sec, indicative of risk for recurrent falls General Gait Details: increased cues for positioning and safety with mobility, encouragement to perform. Assist for stability despite use of RW. Patient very slow and guarded, cues to increase cadence   Stairs            Wheelchair Mobility    Modified Rankin (Stroke Patients Only)       Balance Overall balance assessment: Needs assistance Sitting-balance support: Feet supported Sitting balance-Leahy Scale: Fair     Standing balance support: Bilateral upper extremity supported Standing balance-Leahy Scale: Poor Standing balance comment: reliance on UE support                    Cognition Arousal/Alertness: Awake/alert Behavior During Therapy: Flat affect Overall Cognitive Status: Within Functional Limits for tasks assessed                      Exercises      General Comments General comments (skin integrity, edema, etc.): reviewed spinal precautions with teachback, discussed mobility expectations and positional changes.      Pertinent Vitals/Pain Pain Assessment: 0-10 Pain Score: 8  Pain Location: low back and incision Pain Descriptors / Indicators: Sore Pain Intervention(s): Limited activity within patient's tolerance;Monitored during session;PCA encouraged    Home Living                      Prior Function            PT Goals (current goals can now be found in the care plan section) Acute Rehab PT Goals Patient  Stated Goal: rehab before home PT Goal Formulation: With patient Time For Goal Achievement: 12/11/15 Potential to Achieve Goals: Good Progress towards PT goals: Progressing toward goals    Frequency  Min 5X/week    PT Plan Current plan remains appropriate    Co-evaluation PT/OT/SLP  Co-Evaluation/Treatment: Yes Reason for Co-Treatment: For patient/therapist safety PT goals addressed during session: Mobility/safety with mobility       End of Session Equipment Utilized During Treatment: Back brace Activity Tolerance: Patient limited by pain Patient left: in chair;with call bell/phone within reach;with chair alarm set     Time: 1550-1616 PT Time Calculation (min) (ACUTE ONLY): 26 min  Charges:  $Gait Training: 8-22 mins                    G CodesFabio Asa 2015-12-01, 4:36 PM Charlotte Crumb, PT DPT  [phone]

## 2015-11-30 ENCOUNTER — Encounter (HOSPITAL_COMMUNITY): Payer: Self-pay | Admitting: General Practice

## 2015-11-30 DIAGNOSIS — D6489 Other specified anemias: Secondary | ICD-10-CM

## 2015-11-30 LAB — CBC WITH DIFFERENTIAL/PLATELET
BASOS ABS: 0 10*3/uL (ref 0.0–0.1)
Basophils Relative: 0 %
EOS PCT: 1 %
Eosinophils Absolute: 0.1 10*3/uL (ref 0.0–0.7)
HEMATOCRIT: 21.5 % — AB (ref 36.0–46.0)
Hemoglobin: 6.8 g/dL — CL (ref 12.0–15.0)
LYMPHS ABS: 1.4 10*3/uL (ref 0.7–4.0)
LYMPHS PCT: 16 %
MCH: 28.5 pg (ref 26.0–34.0)
MCHC: 31.6 g/dL (ref 30.0–36.0)
MCV: 90 fL (ref 78.0–100.0)
Monocytes Absolute: 0.9 10*3/uL (ref 0.1–1.0)
Monocytes Relative: 10 %
NEUTROS ABS: 6.8 10*3/uL (ref 1.7–7.7)
Neutrophils Relative %: 73 %
PLATELETS: 190 10*3/uL (ref 150–400)
RBC: 2.39 MIL/uL — AB (ref 3.87–5.11)
RDW: 12.9 % (ref 11.5–15.5)
WBC: 9.3 10*3/uL (ref 4.0–10.5)

## 2015-11-30 LAB — BASIC METABOLIC PANEL
ANION GAP: 7 (ref 5–15)
BUN: 9 mg/dL (ref 6–20)
CHLORIDE: 100 mmol/L — AB (ref 101–111)
CO2: 28 mmol/L (ref 22–32)
Calcium: 8.5 mg/dL — ABNORMAL LOW (ref 8.9–10.3)
Creatinine, Ser: 0.99 mg/dL (ref 0.44–1.00)
GFR calc Af Amer: >60 mL/min (ref >60)
GFR calc non Af Amer: 60 mL/min — ABNORMAL LOW (ref >60)
GLUCOSE: 129 mg/dL — AB (ref 65–99)
POTASSIUM: 3.5 mmol/L (ref 3.5–5.1)
Sodium: 135 mmol/L (ref 135–145)

## 2015-11-30 LAB — GLUCOSE, CAPILLARY
GLUCOSE-CAPILLARY: 118 mg/dL — AB (ref 65–99)
Glucose-Capillary: 116 mg/dL — ABNORMAL HIGH (ref 65–99)
Glucose-Capillary: 138 mg/dL — ABNORMAL HIGH (ref 65–99)
Glucose-Capillary: 109 mg/dL — ABNORMAL HIGH (ref 65–99)
Glucose-Capillary: 136 mg/dL — ABNORMAL HIGH (ref 65–99)

## 2015-11-30 LAB — CBC
HEMATOCRIT: 24.9 % — AB (ref 36.0–46.0)
Hemoglobin: 8.3 g/dL — ABNORMAL LOW (ref 12.0–15.0)
MCH: 29.6 pg (ref 26.0–34.0)
MCHC: 33.3 g/dL (ref 30.0–36.0)
MCV: 88.9 fL (ref 78.0–100.0)
Platelets: 185 10*3/uL (ref 150–400)
RBC: 2.8 MIL/uL — ABNORMAL LOW (ref 3.87–5.11)
RDW: 13.1 % (ref 11.5–15.5)
WBC: 10.2 10*3/uL (ref 4.0–10.5)

## 2015-11-30 LAB — URINE CULTURE
Culture: NO GROWTH
Special Requests: NORMAL

## 2015-11-30 LAB — PHOSPHORUS: Phosphorus: 2.7 mg/dL (ref 2.5–4.6)

## 2015-11-30 LAB — LACTIC ACID, PLASMA: LACTIC ACID, VENOUS: 0.8 mmol/L (ref 0.5–1.9)

## 2015-11-30 LAB — PROCALCITONIN: PROCALCITONIN: 0.45 ng/mL

## 2015-11-30 LAB — PREPARE RBC (CROSSMATCH)

## 2015-11-30 LAB — MAGNESIUM: Magnesium: 2.1 mg/dL (ref 1.7–2.4)

## 2015-11-30 LAB — TROPONIN I: TROPONIN I: 0.05 ng/mL — AB (ref <0.03)

## 2015-11-30 MED ORDER — INSULIN ASPART 100 UNIT/ML ~~LOC~~ SOLN
0.0000 [IU] | Freq: Three times a day (TID) | SUBCUTANEOUS | Status: DC
Start: 2015-11-30 — End: 2015-12-04
  Administered 2015-11-30 – 2015-12-02 (×4): 1 [IU] via SUBCUTANEOUS

## 2015-11-30 MED ORDER — SODIUM CHLORIDE 0.9 % IV SOLN
Freq: Once | INTRAVENOUS | Status: AC
  Administered 2015-11-30: 10:00:00 via INTRAVENOUS

## 2015-11-30 MED ORDER — SODIUM CHLORIDE 0.9 % IV SOLN
INTRAVENOUS | Status: DC
  Administered 2015-11-30 – 2015-12-04 (×2): via INTRAVENOUS

## 2015-11-30 MED ORDER — INSULIN ASPART 100 UNIT/ML ~~LOC~~ SOLN: 0.0000 [IU] | Freq: Every day | SUBCUTANEOUS | Status: DC

## 2015-11-30 MED ORDER — ACETAMINOPHEN 325 MG PO TABS
650.0000 mg | ORAL_TABLET | Freq: Four times a day (QID) | ORAL | Status: DC | PRN
Start: 2015-11-30 — End: 2015-12-02
  Administered 2015-11-30: 650 mg via ORAL
  Filled 2015-11-30: qty 2

## 2015-11-30 NOTE — Progress Notes (Signed)
Subjective: Patient reports sore in back and legs, but improving  Objective: Vital signs in last 24 hours: Temp:  [98.6 F (37 C)-100.5 F (38.1 C)] 98.6 F (37 C) (09/02 0927) Pulse Rate:  [91-102] 101 (09/02 0927) Resp:  [11-36] 18 (09/02 0927) BP: (111-156)/(64-89) 154/89 (09/02 0927) SpO2:  [94 %-100 %] 98 % (09/02 0927)  Intake/Output from previous day: 09/01 0701 - 09/02 0700 In: 2058.3 [P.O.:960; I.V.:845; IV Piggyback:253.3] Out: 2650 [Urine:2650] Intake/Output this shift: Total I/O In: -  Out: 325 [Urine:325]  Physical Exam: Up to chair in brace.    Lab Results:  Recent Labs  11/29/15 1130 11/30/15 0500  WBC 8.2 9.3  HGB 8.1* 6.8*  HCT 25.3* 21.5*  PLT 129* 190   BMET  Recent Labs  11/29/15 0540 11/30/15 0500  NA 137 135  K 3.6 3.5  CL 103 100*  CO2 26 28  GLUCOSE 121* 129*  BUN 9 9  CREATININE 1.14* 0.99  CALCIUM 8.1* 8.5*    Studies/Results: No results found.  Assessment/Plan: Mobilizing with PT.  Receiving 1 unit of blood.  Transfer from ICU.    LOS: 4 days    Dorian HeckleSTERN,Jadee Golebiewski D, MD 11/30/2015, 9:42 AM

## 2015-11-30 NOTE — Progress Notes (Signed)
eLink Physician-Brief Progress Note Patient Name: Kathleen Bishop DOB: 1951-01-31 MRN: [id number]   Date of Service  11/30/2015  HPI/Events of Note  Hgb drop to 6.9  eICU Interventions  Plan: Transfuse 1 unit pRBC Post-transfusion CBC     Intervention Category Intermediate Interventions: Other:  Marque Rademaker 11/30/2015, 6:55 AM

## 2015-11-30 NOTE — Progress Notes (Signed)
Physical Therapy Treatment Patient Details Name: Kathleen Bishop MRN: [id number] DOB: 25-Aug-1950 Today's Date: 11/30/2015    History of Present Illness 65 year old female past medical history significant for diabetes, hypertension, hyperlipidemia, and chronic back pain, HA, and Bil THA.  Pt now s/p L2-5 decompression and fusion.      PT Comments    Patient seen for mobility progression. Patient very limited by lethargy this session. Max cues and encouragement to keep alert and mobilize. Continues to require increased physical assist for all aspects of mobility. Will continue to see as indicated.  Follow Up Recommendations  SNF;Supervision/Assistance - 24 hour     Equipment Recommendations  3in1 (PT)    Recommendations for Other Services       Precautions / Restrictions Precautions Precautions: Fall;Back Precaution Booklet Issued: Yes (comment) Precaution Comments: verbally reviewed and provided hand out to patient Required Braces or Orthoses: Spinal Brace Spinal Brace: Lumbar corset Restrictions Weight Bearing Restrictions: No    Mobility  Bed Mobility Overal bed mobility: Needs Assistance Bed Mobility: Rolling;Sit to Sidelying         Sit to sidelying: Mod assist General bed mobility comments: moderate assist to elevate LEs back to bed and reposition in bed  Transfers Overall transfer level: Needs assistance Equipment used: Rolling walker (2 wheeled) Transfers: Sit to/from Stand Sit to Stand: +2 safety/equipment;Mod assist         General transfer comment: Max cues for arousal and engagement. Cues for hand placement and positioning, assist to elevate to standing  Ambulation/Gait Ambulation/Gait assistance: Min assist;+2 safety/equipment Ambulation Distance (Feet): 80 Feet Assistive device: Rolling walker (2 wheeled) Gait Pattern/deviations: Step-to pattern;Decreased stride length;Shuffle;Drifts right/left;Trunk flexed Gait velocity: decreased Gait  velocity interpretation: <1.8 ft/sec, indicative of risk for recurrent falls General Gait Details: continues to require increased cues for positioning and safety with mobility, encouragement to perform. Assist for stability despite use of RW. Patient very slow and guarded, cues to increase cadence. Pacing required this session. patient lethargic after receiving pain medications   Stairs            Wheelchair Mobility    Modified Rankin (Stroke Patients Only)       Balance Overall balance assessment: Needs assistance Sitting-balance support: Feet supported Sitting balance-Leahy Scale: Fair     Standing balance support: Bilateral upper extremity supported Standing balance-Leahy Scale: Poor Standing balance comment: heavy reliance on RW                    Cognition Arousal/Alertness: Lethargic Behavior During Therapy: Flat affect Overall Cognitive Status: Within Functional Limits for tasks assessed                      Exercises      General Comments        Pertinent Vitals/Pain Pain Assessment: 0-10 Pain Score: 8  Pain Location: back Pain Descriptors / Indicators: Sore;Moaning Pain Intervention(s): Monitored during session;Premedicated before session    Home Living                      Prior Function            PT Goals (current goals can now be found in the care plan section) Acute Rehab PT Goals Patient Stated Goal: rehab before home PT Goal Formulation: With patient Time For Goal Achievement: 12/11/15 Potential to Achieve Goals: Good Progress towards PT goals: Progressing toward goals    Frequency  Min 5X/week  PT Plan Current plan remains appropriate    Co-evaluation             End of Session Equipment Utilized During Treatment: Back brace Activity Tolerance: Patient limited by lethargy;Patient limited by pain Patient left: in bed;with call bell/phone within reach;with bed alarm set     Time: [id number] PT  Time Calculation (min) (ACUTE ONLY): 22 min  Charges:  $Gait Training: 8-22 mins                    G CodesFabio Asa:      Corban Kistler J 11/30/2015, 1:28 PM  Charlotte Crumbevon Ramsay Bognar, PT DPT  [phone]

## 2015-11-30 NOTE — Progress Notes (Signed)
Pt continue to run a temp; new order for Tylenol received from MD; adm to pt and pt sleeping in bed with call light within reach and family at bedside. Will continue to monitor pt closely and report off to incoming RN. Dionne BucyP. Amo Dennisha Mouser RN

## 2015-11-30 NOTE — Consult Note (Signed)
[place] TEAM 1 - Stepdown/ICU TEAM CONSULT F/U NOTE  Kathleen Bishop WUJ:[id number] DOB: 1950/12/02 DOA: 11/26/2015 PCP: Abran Richard, PA-C  Admit HPI / Brief Narrative: 65 year old female w/ history of DM, HTN, HLD, and chronic back pain.  She presented to [facility] 8/29 for elective laminectomy L2-L3-L4 with decompression L2-L5, posterior lumbar interbody arthrodesis, segmental fixation L2 L5 and posterior lateral arthrodesis L2-L5. Surgery was complicated by development of hematuria followed by poor urine output. She also developed rash during the case, which resolved with the administration of Benadryl. She was successfully extubated postoperatively and transferred to the ICU for monitoring. Estimated blood loss for the procedure was 750 mL's, 200 of which was returned to the patient via Cell Saver.   HPI/Subjective: The pt c/o severe pain in her legs.  She states this has been going on since before her surgery.  She denies cp, n/v, sob, or abdom pain.    Recommendations/Plan:  Acute blood loss anemia and anemia of critical illness Hgb fluctuating w/o evidence of gross blood loss at this time - transfusing 1U PRBC this morning - keep Hgb 7.0 or >  Recent Labs Lab 11/27/15 2105 11/28/15 0547 11/28/15 1719 11/29/15 0540 11/29/15 1130 11/30/15 0500  HGB 8.2* 7.8* 7.6* 7.0* 8.1* 6.8*    FUO - SIRS no culture data to suggest active infection - follow temp curve - procalcitonin trending down   HTN BP elevated w/ pain - avoid adjustment for now w/ focus on pain control  HLD Hold med tx until oral intake improved  Mild trop bump not indicative of MI  Hematuria Appears to have been a transient issue - foley now out - follow  Acute kidney injury post op  volume loss related - resolved   Recent Labs Lab 11/27/15 1556 11/28/15 0547 11/28/15 1719 11/29/15 0540 11/30/15 0500  CREATININE 1.59* 1.51* 1.35* 1.14* 0.99    Hypomagnesemia resolved   DM2 CBG  currently well controlled   Chronic back pain s/p laminectomy/decompression/fixation L2-L5  Morbid obesity - Body mass index is 41.27 kg/m.   Code Status: FULL Family Communication: no family present at time of exam  Antibiotics: none  DVT prophylaxis: SCDs  Objective: Blood pressure (!) 154/89, pulse (!) 101, temperature 98.6 F (37 C), temperature source Oral, resp. rate 18, height 5\' 5"  (1.651 m), weight 112.5 kg (248 lb), SpO2 98 %.  Intake/Output Summary (Last 24 hours) at 11/30/15 0935 Last data filed at 11/30/15 0900  Gross per 24 hour  Intake          1858.33 ml  Output             2675 ml  Net          -816.67 ml     Exam: General: No acute respiratory distress Lungs: Clear to auscultation bilaterally without wheezes or crackles Cardiovascular: Regular rate and rhythm without murmur gallop or rub - distant HS Abdomen: Nontender, obese, soft, bowel sounds positive, no rebound, no ascites, no appreciable mass Extremities: No significant cyanosis, or clubbing - trace edema bilateral lower extremities  Data Reviewed: Basic Metabolic Panel:  Recent Labs Lab 11/27/15 1556 11/28/15 0547 11/28/15 1719 11/29/15 0540 11/30/15 0500  NA 135 137 136 137 135  K 4.2 3.6 3.6 3.6 3.5  CL 108 108 105 103 100*  CO2 22 22 25 26 28   GLUCOSE 144* 127* 129* 121* 129*  BUN 17 13 10 9 9   CREATININE 1.59* 1.51* 1.35* 1.14* 0.99  CALCIUM  7.7* 7.9* 8.3* 8.1* 8.5*  MG  --  1.5*  --  2.2 2.1  PHOS  --  2.6  --  2.4* 2.7    CBC:  Recent Labs Lab 11/28/15 0547 11/28/15 1719 11/29/15 0540 11/29/15 1130 11/30/15 0500  WBC 10.3 9.5 9.9 8.2 9.3  NEUTROABS 8.2*  --  7.5  --  6.8  HGB 7.8* 7.6* 7.0* 8.1* 6.8*  HCT 23.7* 23.2* 21.6* 25.3* 21.5*  MCV 90.8 90.6 91.1 91.0 90.0  PLT 142* 151 137* 129* 190    Liver Function Tests: No results for input(s): AST, ALT, ALKPHOS, BILITOT, PROT, ALBUMIN in the last 168 hours. No results for input(s): LIPASE, AMYLASE in the last  168 hours. No results for input(s): AMMONIA in the last 168 hours.  Coags:  Recent Labs Lab 11/27/15 0756  INR 1.15    Cardiac Enzymes:  Recent Labs Lab 11/28/15 0547 11/28/15 1050 11/29/15 0540 11/30/15 0615  TROPONINI 0.07* 0.04* 0.04* 0.05*    CBG:  Recent Labs Lab 11/29/15 1545 11/29/15 1918 11/29/15 2344 11/30/15 0342 11/30/15 0800  GLUCAP 110* 149* 135* 118* 116*    Recent Results (from the past 240 hour(s))  Culture, blood (Routine X 2) w Reflex to ID Panel     Status: None (Preliminary result)   Collection Time: 11/28/15 10:50 AM  Result Value Ref Range Status   Specimen Description BLOOD RIGHT ARM  Final   Special Requests   Final    BOTTLES DRAWN AEROBIC AND ANAEROBIC 5CC BLUE 4CC RED   Culture NO GROWTH 1 DAY  Final   Report Status PENDING  Incomplete  Culture, blood (Routine X 2) w Reflex to ID Panel     Status: None (Preliminary result)   Collection Time: 11/28/15 10:55 AM  Result Value Ref Range Status   Specimen Description BLOOD LEFT HAND  Final   Special Requests IN PEDIATRIC BOTTLE 0.5CC  Final   Culture NO GROWTH 1 DAY  Final   Report Status PENDING  Incomplete     Studies:   Recent x-ray studies have been reviewed in detail by the Attending Physician  Scheduled Meds:  Scheduled Meds: . sodium chloride   Intravenous Once  . docusate sodium  100 mg Oral BID  . fentaNYL   Intravenous Q4H  . insulin aspart  0-9 Units Subcutaneous Q4H  . pantoprazole  20 mg Oral Daily  . senna  1 tablet Oral BID  . sodium chloride flush  10-40 mL Intracatheter Q12H    Lonia BloodMCCLUNG,France Noyce T , MD   [facility] Office  [phone] Pager - Text Page per Loretha StaplerAmion as per below:  On-Call/Text Page:      [email or web address]      password [email or web address]  If 7PM-7AM, please contact night-coverage [email or web address] Password [email or web address] 11/30/2015, 9:35 AM   LOS: 4 days

## 2015-11-30 NOTE — Progress Notes (Signed)
Pt admitted to unit as a transfer from Kathleen Bishop. Pt A&O x4; back incision honey comb dsg changed upon arrival to the room; new dsg clean,dry and intact with no stain or active bleeding noted. Pt skin clean, dry and intact with no wounds or pressure ulcers noted except for back surgical incision. Pt vitals taken and noted to have a temp; pt educated on the importance of incentive spirometer; pt practiced IS as well as deep breathing and coughing. PRN pain medication adm;  Pt oriented to the unit and room; fall/safety precaution and prevention education completed. Bed alarm on with call light within reach. Will continue to closely monitor. Dionne BucyP. Amo Tate Zagal RN

## 2015-11-30 NOTE — Progress Notes (Signed)
CRITICAL VALUE ALERT  Critical value received: 6.8 hgb  Date of notification:  11/30/2015  Time of notification:  0650  Critical value read back:Yes.    Nurse who received alert:  Estill DoomsASHEENA Consetta Cosner RN   MD notified (1st page):  CCM [phone]    Time of first page:  [phone]     MD notified (2nd page):  Time of second page:  Responding MD:  Pola CornELINK NURSE  Time MD responded:  [phone]  CCM NOTIFIED AND AWARE

## 2015-12-01 DIAGNOSIS — E876 Hypokalemia: Secondary | ICD-10-CM

## 2015-12-01 DIAGNOSIS — E118 Type 2 diabetes mellitus with unspecified complications: Secondary | ICD-10-CM

## 2015-12-01 LAB — COMPREHENSIVE METABOLIC PANEL
ALK PHOS: 124 U/L (ref 38–126)
ALT: 51 U/L (ref 14–54)
AST: 58 U/L — ABNORMAL HIGH (ref 15–41)
Albumin: 2.8 g/dL — ABNORMAL LOW (ref 3.5–5.0)
Anion gap: 7 (ref 5–15)
BUN: 10 mg/dL (ref 6–20)
CALCIUM: 8.7 mg/dL — AB (ref 8.9–10.3)
CO2: 30 mmol/L (ref 22–32)
CREATININE: 0.97 mg/dL (ref 0.44–1.00)
Chloride: 100 mmol/L — ABNORMAL LOW (ref 101–111)
GFR calc non Af Amer: >60 mL/min (ref >60)
Glucose, Bld: 135 mg/dL — ABNORMAL HIGH (ref 65–99)
Potassium: 3.3 mmol/L — ABNORMAL LOW (ref 3.5–5.1)
SODIUM: 137 mmol/L (ref 135–145)
Total Bilirubin: 1.3 mg/dL — ABNORMAL HIGH (ref 0.3–1.2)
Total Protein: 5.7 g/dL — ABNORMAL LOW (ref 6.5–8.1)

## 2015-12-01 LAB — CBC
HEMATOCRIT: 24.9 % — AB (ref 36.0–46.0)
HEMOGLOBIN: 8.1 g/dL — AB (ref 12.0–15.0)
MCH: 29 pg (ref 26.0–34.0)
MCHC: 32.5 g/dL (ref 30.0–36.0)
MCV: 89.2 fL (ref 78.0–100.0)
Platelets: 215 10*3/uL (ref 150–400)
RBC: 2.79 MIL/uL — AB (ref 3.87–5.11)
RDW: 13.3 % (ref 11.5–15.5)
WBC: 8.3 10*3/uL (ref 4.0–10.5)

## 2015-12-01 LAB — TYPE AND SCREEN
ABO/RH(D): A POS
ANTIBODY SCREEN: NEGATIVE
Unit division: 0

## 2015-12-01 LAB — GLUCOSE, CAPILLARY
GLUCOSE-CAPILLARY: 113 mg/dL — AB (ref 65–99)
GLUCOSE-CAPILLARY: 119 mg/dL — AB (ref 65–99)
Glucose-Capillary: 148 mg/dL — ABNORMAL HIGH (ref 65–99)
Glucose-Capillary: 134 mg/dL — ABNORMAL HIGH (ref 65–99)

## 2015-12-01 MED ORDER — POTASSIUM CHLORIDE CRYS ER 20 MEQ PO TBCR: 40.0000 meq | EXTENDED_RELEASE_TABLET | Freq: Once | ORAL | Status: DC

## 2015-12-01 MED ORDER — OXYCODONE-ACETAMINOPHEN 5-325 MG PO TABS
1.0000 | ORAL_TABLET | ORAL | Status: DC | PRN
  Administered 2015-12-01 – 2015-12-04 (×16): 2 via ORAL
  Filled 2015-12-01 (×16): qty 2

## 2015-12-01 NOTE — Progress Notes (Addendum)
Pt still running low grade fever and in pain able to ambulate to bedside commode and now in chair instructed her again on incentive spirometer, potassium is 3.2. Will continue to monitor.

## 2015-12-01 NOTE — Progress Notes (Signed)
Subjective: Patient reports still quite sore  Objective: Vital signs in last 24 hours: Temp:  [98.7 F (37.1 C)-100.4 F (38 C)] 99.6 F (37.6 C) (09/03 0604) Pulse Rate:  [89-98] 98 (09/03 0604) Resp:  [17-18] 18 (09/03 0604) BP: (129-165)/(61-108) 135/93 (09/03 0604) SpO2:  [96 %-100 %] 98 % (09/03 0604)  Intake/Output from previous day: 09/02 0701 - 09/03 0700 In: 888.8 [P.O.:540; I.V.:21.3; Blood:327.5] Out: 325 [Urine:325] Intake/Output this shift: No intake/output data recorded.  Physical Exam: Mobilizing slowly.  C/o LBP 7/10  Lab Results:  Recent Labs  11/30/15 1445 12/01/15 0400  WBC 10.2 8.3  HGB 8.3* 8.1*  HCT 24.9* 24.9*  PLT 185 215   BMET  Recent Labs  11/30/15 0500 12/01/15 0400  NA 135 137  K 3.5 3.3*  CL 100* 100*  CO2 28 30  GLUCOSE 129* 135*  BUN 9 10  CREATININE 0.99 0.97  CALCIUM 8.5* 8.7*    Studies/Results: No results found.  Assessment/Plan: Encourage ambulation.  Will start po oxycodone.    LOS: 5 days    Kathleen Bishop,Kathleen Prinsen D, MD 12/01/2015, 9:46 AM

## 2015-12-01 NOTE — Consult Note (Signed)
Medical Consultation   Kathleen Bishop  WUJ:[id number]  DOB: 06-01-1950  DOA: 11/26/2015  PCP: Phyllis Ginger    Requesting physician: Dr. Barnett [facility]  Reason for consultation: Multiple medical problems   History of Present Illness:  65 year old BF PMHx DM Type 2 controlled with complication , HTN, HLD, chronic back pain.    She presented to [facility] 8/29 for elective laminectomy L2-L3-L4 with decompression L2-L5, posterior lumbar interbody arthrodesis, segmental fixation L2 L5 and posterior lateral arthrodesis L2-L5. Surgery was complicated by development of hematuria followed by poor urine output. She also developed rash during the case, which resolved with the administration of Benadryl. She was successfully extubated postoperatively and transferred to the ICU for monitoring. Estimated blood loss for the procedure was 750 mL's, 200 of which was returned to the patient via Cell Saver  9/3 states back was injured when unloading laundry from truck and the truck gate broke and struck her in the back. Negative CP/SOB. Positive low back pain rated 8/10. Believes surgeon adjust pain medication today. MAXIMUM TEMPERATURE last 24 hours 38C    Review of Systems:  Review of Systems  Constitutional: Negative.   HENT: Negative.   Eyes: Negative.   Respiratory: Negative.   Cardiovascular: Negative.   Gastrointestinal: Negative.   Genitourinary: Negative.   Musculoskeletal: Positive for back pain and joint pain.  Skin: Negative.   Neurological: Positive for tingling and sensory change.  Endo/Heme/Allergies: Negative.   Psychiatric/Behavioral: Negative.      Past Medical History: Past Medical History:  Diagnosis Date  . Chronic back pain    spondylolisthesis  . Diabetes mellitus without complication (HCC)    takes Metformin daily   . Eczema   . Headache    occasionally  . Hyperlipidemia    takes Simvastatin nightly  . Hypertension    takes Lisinopril daily  . Weakness    in hands and feet occasionally    Past Surgical History: Past Surgical History:  Procedure Laterality Date  . ABDOMINAL HYSTERECTOMY     partial  . JOINT REPLACEMENT Bilateral    hip     Allergies:   Allergies  Allergen Reactions  . No Known Allergies      Social History:  reports that she has never smoked. She has never used smokeless tobacco. She reports that she does not drink alcohol or use drugs.   Family History: Family history reviewed and not pertinent   Procedures/Significant Events:  8/29 Laminectomy L2-L3 and L4 with decompression of L2 L3 L4 and L5 nerve roots with more work than required for simple posterior lumbar interbody arthrodesis. Posterior lumbar interbody arthrodesis using peek spacers local autograft and allograft L2-3 L3-4 L4-5. Segmental fixation L2-L5 with posterior lateral arthrodesis L2-L5  Cultures 8/31 blood right arm/left hand NGTD 9/1 urine negative  Antimicrobials: None   Devices None   LINES / TUBES:  None    Continuous Infusions: . sodium chloride 10 mL/hr at 11/30/15 1352     Physical Exam: Vitals:   12/01/15 0158 12/01/15 0604 12/01/15 1005 12/01/15 1337  BP: (!) 165/76 (!) 135/93 (!) 154/73 (!) 151/81  Pulse: 91 98 87 91  Resp: 18 18 16 13   Temp: 99.4 F (37.4 C) 99.6 F (37.6 C) (!) 100.4 F (38 C) 100.2 F (37.9 C)  TempSrc: Oral Oral Oral Oral  SpO2: 100% 98% 96% 100%  Weight:  Height:        General: A/O 4, C/O back pain but somewhat groggy, No acute respiratory distress Eyes: negative scleral hemorrhage, negative anisocoria, negative icterus ENT: Negative Runny nose, negative gingival bleeding, Neck:  Negative scars, masses, torticollis, lymphadenopathy, JVD Lungs: Clear to auscultation bilaterally without wheezes or crackles Cardiovascular: Regular rate and rhythm without murmur gallop or rub normal S1 and S2 Abdomen: Morbidly obese, negative abdominal  pain, nondistended, positive soft, bowel sounds, no rebound, no ascites, no appreciable mass Extremities: No significant cyanosis, clubbing, or edema bilateral lower extremities Skin: Negative rashes, lesions, ulcers Psychiatric:  Negative depression, negative anxiety, negative fatigue, negative mania  Central nervous system:  Cranial nerves II through XII intact, tongue/uvula midline, all extremities muscle strength 5/5, decreased sensation R LE, negative dysarthria, negative expressive aphasia, negative receptive aphasia.  Data reviewed:  I have personally reviewed following labs and imaging studies Labs:  CBC:  Recent Labs Lab 11/28/15 0547  11/29/15 0540 11/29/15 1130 11/30/15 0500 11/30/15 1445 12/01/15 0400  WBC 10.3  < > 9.9 8.2 9.3 10.2 8.3  NEUTROABS 8.2*  --  7.5  --  6.8  --   --   HGB 7.8*  < > 7.0* 8.1* 6.8* 8.3* 8.1*  HCT 23.7*  < > 21.6* 25.3* 21.5* 24.9* 24.9*  MCV 90.8  < > 91.1 91.0 90.0 88.9 89.2  PLT 142*  < > 137* 129* 190 185 215  < > = values in this interval not displayed.  Basic Metabolic Panel:  Recent Labs Lab 11/28/15 0547 11/28/15 1719 11/29/15 0540 11/30/15 0500 12/01/15 0400  NA 137 136 137 135 137  K 3.6 3.6 3.6 3.5 3.3*  CL 108 105 103 100* 100*  CO2 22 25 26 28 30   GLUCOSE 127* 129* 121* 129* 135*  BUN 13 10 9 9 10   CREATININE 1.51* 1.35* 1.14* 0.99 0.97  CALCIUM 7.9* 8.3* 8.1* 8.5* 8.7*  MG 1.5*  --  2.2 2.1  --   PHOS 2.6  --  2.4* 2.7  --    GFR Estimated Creatinine Clearance: 75.2 mL/min (by C-G formula based on SCr of 0.97 mg/dL). Liver Function Tests:  Recent Labs Lab 12/01/15 0400  AST 58*  ALT 51  ALKPHOS 124  BILITOT 1.3*  PROT 5.7*  ALBUMIN 2.8*   No results for input(s): LIPASE, AMYLASE in the last 168 hours. No results for input(s): AMMONIA in the last 168 hours. Coagulation profile  Recent Labs Lab 11/27/15 0756  INR 1.15    Cardiac Enzymes:  Recent Labs Lab 11/28/15 0547 11/28/15 1050  11/29/15 0540 11/30/15 0615  TROPONINI 0.07* 0.04* 0.04* 0.05*   BNP: Invalid input(s): POCBNP CBG:  Recent Labs Lab 11/30/15 1642 11/30/15 2245 12/01/15 0637 12/01/15 1143 12/01/15 1651  GLUCAP 109* 136* 148* 113* 119*   D-Dimer No results for input(s): DDIMER in the last 72 hours. Hgb A1c No results for input(s): HGBA1C in the last 72 hours. Lipid Profile No results for input(s): CHOL, HDL, LDLCALC, TRIG, CHOLHDL, LDLDIRECT in the last 72 hours. Thyroid function studies No results for input(s): TSH, T4TOTAL, T3FREE, THYROIDAB in the last 72 hours.  Invalid input(s): FREET3 Anemia work up No results for input(s): VITAMINB12, FOLATE, FERRITIN, TIBC, IRON, RETICCTPCT in the last 72 hours. Urinalysis No results found for: COLORURINE, APPEARANCEUR, LABSPEC, PHURINE, GLUCOSEU, HGBUR, BILIRUBINUR, KETONESUR, PROTEINUR, UROBILINOGEN, NITRITE, LEUKOCYTESUR   Microbiology Recent Results (from the past 240 hour(s))  Culture, blood (Routine X 2) w Reflex to ID Panel  Status: None (Preliminary result)   Collection Time: 11/28/15 10:50 AM  Result Value Ref Range Status   Specimen Description BLOOD RIGHT ARM  Final   Special Requests   Final    BOTTLES DRAWN AEROBIC AND ANAEROBIC 5CC BLUE 4CC RED   Culture NO GROWTH 3 DAYS  Final   Report Status PENDING  Incomplete  Culture, blood (Routine X 2) w Reflex to ID Panel     Status: None (Preliminary result)   Collection Time: 11/28/15 10:55 AM  Result Value Ref Range Status   Specimen Description BLOOD LEFT HAND  Final   Special Requests IN PEDIATRIC BOTTLE 0.5CC  Final   Culture NO GROWTH 3 DAYS  Final   Report Status PENDING  Incomplete  Culture, Urine     Status: None   Collection Time: 11/29/15 11:32 AM  Result Value Ref Range Status   Specimen Description URINE, CATHETERIZED  Final   Special Requests Normal  Final   Culture NO GROWTH  Final   Report Status 11/30/2015 FINAL  Final       Inpatient Medications:    Scheduled Meds: . docusate sodium  100 mg Oral BID  . insulin aspart  0-5 Units Subcutaneous QHS  . insulin aspart  0-9 Units Subcutaneous TID WC  . pantoprazole  20 mg Oral Daily  . potassium chloride  40 mEq Oral Once  . senna  1 tablet Oral BID   Continuous Infusions: . sodium chloride 10 mL/hr at 11/30/15 1352     Radiological Exams on Admission: No results found.  Impression/Recommendations Active Problems:   Lumbar stenosis   Acute kidney injury (HCC)   Acute post-operative pain   Controlled diabetes mellitus type 2 with complications (HCC)   Hypokalemia   Hypomagnesemia  Acute blood loss anemia and anemia of critical illness -Hgb fluctuating w/o evidence of gross blood loss at this time - transfused 1U PRBC   - Transfuse for hemoglobin<7  Recent Labs Lab 11/29/15 0540 11/29/15 1130 11/30/15 0500 11/30/15 1445 12/01/15 0400  HGB 7.0* 8.1* 6.8* 8.3* 8.1*    FUO - SIRS -no culture data to suggest active infection. Negative leukocytosis, left shift, bands. Would not start antibiotics at this time. -Follow CBC closely. Lactic acid on 9/4   HTN -BP elevated w/ pain - avoid adjustment for now w/ focus on pain control  HLD -Hold med tx until oral intake improved  Mild trop bump not indicative of MI  Hematuria Appears to have been a transient issue - foley now out - follow  Acute kidney injury post op  -volume loss related - resolved  Lab Results  Component Value Date   CREATININE 0.97 12/01/2015   CREATININE 0.99 11/30/2015   CREATININE 1.14 (H) 11/29/2015     Hypomagnesemia resolved  Hypokalemia -K Dur 40 mEq  DM Type 2 controlled with complication -8/22 Hemoglobin A1c= 6.9 -Sensitive SSI    Acute on Chronic back pain s/p laminectomy/decompression/fixation L2-L5 -Pain medication management by surgery  Morbid obesity - Body mass index is 41.27 kg/m.       Thank you for this consultation.  Our [facility] hospitalist team will  follow the patient with you.   Time Spent: 30 minute  WOODS, CURTIS J M.D. [facility] 12/01/2015, 5:18 PM

## 2015-12-01 NOTE — Clinical Social Work Placement (Signed)
   CLINICAL SOCIAL WORK PLACEMENT  NOTE  Date:  12/01/2015  Patient Details  Name: Rikki SpearingLillie M Landsberg MRN: [id number] Date of Birth: [birth year]  Clinical Social Work is seeking post-discharge placement for this patient at the Skilled  Nursing Facility level of care (*CSW will initial, date and re-position this form in  chart as items are completed):  Yes   Patient/family provided with [facility] Clinical Social Work Department's list of facilities offering this level of care within the geographic area requested by the patient (or if unable, by the patient's family).  Yes   Patient/family informed of their freedom to choose among providers that offer the needed level of care, that participate in Medicare, Medicaid or managed care program needed by the patient, have an available bed and are willing to accept the patient.  Yes   Patient/family informed of [facility]'s ownership interest in [facility] and [facility], as well as of the fact that they are under no obligation to receive care at these facilities.  PASRR submitted to EDS on 11/27/15     PASRR number received on 11/27/15     Existing PASRR number confirmed on       FL2 transmitted to all facilities in geographic area requested by pt/family on 11/27/15     FL2 transmitted to all facilities within larger geographic area on 11/27/15     Patient informed that his/her managed care company has contracts with or will negotiate with certain facilities, including the following:        Yes   Patient/family informed of bed offers received.  Patient chooses bed at       Physician recommends and patient chooses bed at      Patient to be transferred to   on  .  Patient to be transferred to facility by       Patient family notified on   of transfer.  Name of family member notified:        PHYSICIAN Please sign FL2, Please prepare priority discharge summary, including medications, Please prepare prescriptions      Additional Comment:   Info sent to [facility] facilities as well per patient's request.  _______________________________________________ Venita Lickampbell, Corbyn Wildey B, LCSW 12/01/2015, 3:52 PM

## 2015-12-01 NOTE — Clinical Social Work Note (Signed)
Clinical Social Work Assessment  Patient Details  Name: Kathleen Bishop MRN: [id number] Date of Birth: 1950-04-09  Date of referral:  12/01/15               Reason for consult:  Discharge Planning, Facility Placement                Permission sought to share information with:  Facility Sport and exercise psychologist, Family Supports Permission granted to share information::  Yes, Verbal Permission Granted  Name::     Sister  Agency::  SNFs  Relationship::     Contact Information:     Housing/Transportation Living arrangements for the past 2 months:  Single Family Home Source of Information:  Patient Patient Interpreter Needed:  None Criminal Activity/Legal Involvement Pertinent to Current Situation/Hospitalization:  No - Comment as needed Significant Relationships:  Siblings Lives with:  Self Do you feel safe going back to the place where you live?  Yes Need for family participation in patient care:  No (Coment)  Care giving concerns:  Patient reports that she does agree with rec for SNF. She does not feel capable of returning home alone at this time.    Social Worker assessment / plan:  CSW met with patient at bedside to complete assessment. 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 The patient shares that she has had hip surgeries in the past but never a back surgery. She is in a lot of pain at this time. CSW explained that PT has recommended SNF for the patient and the patient agrees with this recommendation. The patient states that she would also like [place] to look into [place] facilities. CSW explained SNF search/placement process and answered the patient's questions. CSW will followup with any additional bed offers received from [place] area.  Employment status:  [employer] information:  Other (Comment Required) (Worker's Comp) PT Recommendations:  [place] / Referral to community resources:  [place]  Patient/Family's Response to care:  The patient is appreciative of the care she is receiving and thanks CSW for placement assistance.  Patient/Family's Understanding of and Emotional Response to Diagnosis, Current Treatment, and Prognosis: The patient appears to have a good understanding of the reason for her admission and her post DC needs. She is still experiencing pain but appears to be coping well with this hospitalization.   Emotional Assessment Appearance:  Appears stated age Attitude/Demeanor/Rapport:  Other (The patient appears to be experiencing some pain. She rates her pain as an 8.) Affect (typically observed):  Accepting, Appropriate Orientation:  Oriented to Self, Oriented to Place, Oriented to  Time, Oriented to Situation Alcohol / Substance use:  Not Applicable Psych involvement (Current and /or in the community):  No (Comment)  Discharge Needs  Concerns to be addressed:  Discharge Planning Concerns, Care Coordination Readmission within the last 30 days:  No Current discharge risk:  Physical Impairment Barriers to Discharge:  Continued Medical Work up   Rigoberto Noel, LCSW 12/01/2015, 3:44 PM

## 2015-12-02 DIAGNOSIS — R509 Fever, unspecified: Secondary | ICD-10-CM

## 2015-12-02 DIAGNOSIS — R319 Hematuria, unspecified: Secondary | ICD-10-CM

## 2015-12-02 DIAGNOSIS — E785 Hyperlipidemia, unspecified: Secondary | ICD-10-CM

## 2015-12-02 DIAGNOSIS — I10 Essential (primary) hypertension: Secondary | ICD-10-CM

## 2015-12-02 DIAGNOSIS — D62 Acute posthemorrhagic anemia: Secondary | ICD-10-CM | POA: Diagnosis present

## 2015-12-02 LAB — CBC WITH DIFFERENTIAL/PLATELET
BASOS PCT: 0 %
Basophils Absolute: 0 10*3/uL (ref 0.0–0.1)
Eosinophils Absolute: 0.3 10*3/uL (ref 0.0–0.7)
Eosinophils Relative: 3 %
HEMATOCRIT: 25.2 % — AB (ref 36.0–46.0)
HEMOGLOBIN: 8.2 g/dL — AB (ref 12.0–15.0)
LYMPHS ABS: 1.7 10*3/uL (ref 0.7–4.0)
LYMPHS PCT: 20 %
MCH: 29.2 pg (ref 26.0–34.0)
MCHC: 32.5 g/dL (ref 30.0–36.0)
MCV: 89.7 fL (ref 78.0–100.0)
MONO ABS: 1 10*3/uL (ref 0.1–1.0)
MONOS PCT: 12 %
NEUTROS ABS: 5.6 10*3/uL (ref 1.7–7.7)
NEUTROS PCT: 65 %
Platelets: 242 10*3/uL (ref 150–400)
RBC: 2.81 MIL/uL — ABNORMAL LOW (ref 3.87–5.11)
RDW: 13.2 % (ref 11.5–15.5)
WBC: 8.6 10*3/uL (ref 4.0–10.5)

## 2015-12-02 LAB — GLUCOSE, CAPILLARY
GLUCOSE-CAPILLARY: 125 mg/dL — AB (ref 65–99)
Glucose-Capillary: 123 mg/dL — ABNORMAL HIGH (ref 65–99)
Glucose-Capillary: 83 mg/dL (ref 65–99)
Glucose-Capillary: 128 mg/dL — ABNORMAL HIGH (ref 65–99)

## 2015-12-02 LAB — COMPREHENSIVE METABOLIC PANEL
ALBUMIN: 2.8 g/dL — AB (ref 3.5–5.0)
ALK PHOS: 134 U/L — AB (ref 38–126)
ALT: 69 U/L — ABNORMAL HIGH (ref 14–54)
ANION GAP: 6 (ref 5–15)
AST: 54 U/L — ABNORMAL HIGH (ref 15–41)
BILIRUBIN TOTAL: 1 mg/dL (ref 0.3–1.2)
BUN: 8 mg/dL (ref 6–20)
CALCIUM: 8.6 mg/dL — AB (ref 8.9–10.3)
CO2: 31 mmol/L (ref 22–32)
Chloride: 100 mmol/L — ABNORMAL LOW (ref 101–111)
Creatinine, Ser: 0.94 mg/dL (ref 0.44–1.00)
GFR calc Af Amer: >60 mL/min (ref >60)
GLUCOSE: 142 mg/dL — AB (ref 65–99)
Potassium: 3.1 mmol/L — ABNORMAL LOW (ref 3.5–5.1)
Sodium: 137 mmol/L (ref 135–145)
TOTAL PROTEIN: 5.7 g/dL — AB (ref 6.5–8.1)

## 2015-12-02 LAB — MAGNESIUM: MAGNESIUM: 1.8 mg/dL (ref 1.7–2.4)

## 2015-12-02 LAB — LACTIC ACID, PLASMA: Lactic Acid, Venous: 0.6 mmol/L (ref 0.5–1.9)

## 2015-12-02 MED ORDER — POTASSIUM CHLORIDE CRYS ER 20 MEQ PO TBCR
40.0000 meq | EXTENDED_RELEASE_TABLET | Freq: Two times a day (BID) | ORAL | Status: AC
  Administered 2015-12-02 (×2): 40 meq via ORAL
  Filled 2015-12-02 (×2): qty 2

## 2015-12-02 NOTE — Care Management Note (Signed)
Case Management Note  Patient Details  Name: Kathleen SpearingLillie M Archibald MRN: [id number] Date of Birth: November 16, 1950  Subjective/Objective:                    Action/Plan: Plan is SNF when medically ready. CM following for d/c needs.   Expected Discharge Date:                  Expected Discharge Plan:  Skilled Nursing Facility  In-House Referral:  Clinical Social Work  Discharge planning Services  CM Consult  Post Acute Care Choice:    Choice offered to:     DME Arranged:    DME Agency:     HH Arranged:    HH Agency:     Status of Service:  In process, will continue to follow  If discussed at Long Length of Stay Meetings, dates discussed:    Additional Comments:  Kermit BaloKelli F Christopher Hink, RN 12/02/2015, 4:32 PM

## 2015-12-02 NOTE — Progress Notes (Signed)
No acute events Moving legs well Stable Therapy

## 2015-12-02 NOTE — Progress Notes (Signed)
Physical Therapy Treatment Patient Details Name: Kathleen Bishop MRN: [id number] DOB: 1950-09-28 Today's Date: 12/02/2015    History of Present Illness 65 year old female past medical history significant for diabetes, hypertension, hyperlipidemia, and chronic back pain, HA, and Bil THA.  Pt now s/p L2-5 decompression and fusion.      PT Comments    Pt ambulated 100' with RW and min A, currently requires only +1 assist, though still needs mod A for sit to stand. PT will continue to follow.   Follow Up Recommendations  SNF;Supervision/Assistance - 24 hour     Equipment Recommendations  3in1 (PT)    Recommendations for Other Services       Precautions / Restrictions Precautions Precautions: Fall;Back Precaution Comments: pt only able to recall bending, did not remember learning arching or twisting Required Braces or Orthoses: Spinal Brace Spinal Brace: Lumbar corset Restrictions Weight Bearing Restrictions: No    Mobility  Bed Mobility Overal bed mobility: Needs Assistance Bed Mobility: Rolling;Sidelying to Sit;Sit to Supine Rolling: Supervision Sidelying to sit: Mod assist   Sit to supine: Min assist   General bed mobility comments: pt needed vc's to prevent twisting with rolling in bed. Mod A for trunk elevation into sitting. Min A for legs into bed with return to supine  Transfers Overall transfer level: Needs assistance Equipment used: Rolling walker (2 wheeled) Transfers: Sit to/from Stand Sit to Stand: Mod assist         General transfer comment: mod A from bed and toilet, vc's  and tactile cues for fwd wt shift  Ambulation/Gait Ambulation/Gait assistance: +2 safety/equipment;Min assist Ambulation Distance (Feet): 100 Feet Assistive device: Rolling walker (2 wheeled) Gait Pattern/deviations: Step-through pattern Gait velocity: decreased Gait velocity interpretation: Below normal speed for age/gender General Gait Details: Pt not shuffling feet today,  vc's for posture, chair brought behind but did not have to sit with urgency. Tolerated ambulation well today. Fatigued after 100' with slightly increased back pain   Stairs            Wheelchair Mobility    Modified Rankin (Stroke Patients Only)       Balance Overall balance assessment: Needs assistance Sitting-balance support: Feet supported;No upper extremity supported Sitting balance-Leahy Scale: Fair Sitting balance - Comments: supervision in sitting   Standing balance support: Single extremity supported Standing balance-Leahy Scale: Poor Standing balance comment: able to hold counter with one hand to wash hands but unable to maintain balance without any support                    Cognition Arousal/Alertness: Awake/alert Behavior During Therapy: WFL for tasks assessed/performed Overall Cognitive Status: Within Functional Limits for tasks assessed                      Exercises      General Comments General comments (skin integrity, edema, etc.): pt with some STM issues and mild confusion, improved as she was up, she was napping upon PT arrival      Pertinent Vitals/Pain Pain Assessment: 0-10 Pain Score: 7  Pain Location: back and legs Pain Descriptors / Indicators: Aching;Sore Pain Intervention(s): Monitored during session    Home Living                      Prior Function            PT Goals (current goals can now be found in the care plan section) Acute Rehab PT Goals  Patient Stated Goal: rehab before home PT Goal Formulation: With patient Time For Goal Achievement: 12/11/15 Potential to Achieve Goals: Good Progress towards PT goals: Progressing toward goals    Frequency  Min 5X/week    PT Plan Current plan remains appropriate    Co-evaluation             End of Session Equipment Utilized During Treatment: Gait belt;Back brace Activity Tolerance: Patient tolerated treatment well Patient left: in chair;with  call bell/phone within reach;with chair alarm set     Time: 6962-9528 PT Time Calculation (min) (ACUTE ONLY): 25 min  Charges:  $Gait Training: 23-37 mins                    G Codes:     Lyanne Co, PT  Acute Rehab Services  [phone]  [place], [place] 12/02/2015, 2:14 PM

## 2015-12-02 NOTE — Clinical Social Work Note (Signed)
CSW met with pt provide bed offers. Pt would like a SNF closer to home, CSW called [facility] and [facility] regarding bed offers. CSW will follow up with answer. CSW will continue to follow.   Darden Dates, MSW,LCSW Clinical Social Worker [phone]

## 2015-12-02 NOTE — Progress Notes (Signed)
PROGRESS NOTE    Kathleen SpearingLillie M Bishop  UEA:[id number] DOB: 03/03/1951 DOA: 11/26/2015 PCP: Abran RichardBAUCOM, JENNY B, PA-C     Brief Narrative:  Kathleen Bishop is a 65 year old BF with past medical hx of DM Type 2 controlled with complication, HTN, HLD, chronic back pain. [facility] for elective laminectomy L2-L3-L4 with decompression L2-L5, posterior lumbar interbody arthrodesis, segmental fixation L2 L5 and posterior lateral arthrodesis L2-L5. Surgery was complicated by development of hematuria followed by poor urine output. She also developed rash during the case, which resolved with the administration of Benadryl. She was successfully extubated postoperatively and transferred to the ICU for monitoring. Estimated blood loss for the procedure was 750 mL's, 200 of which was returned to the patient via Cell Saver. TRH consulted for multiple medical problems.   Assessment & Plan:   Active Problems:   Lumbar stenosis   Acute kidney injury (HCC)   Acute post-operative pain   Controlled diabetes mellitus type 2 with complications (HCC)   Hypokalemia   Hypomagnesemia   Postoperative anemia due to acute blood loss   Fever, unspecified   Hypertension   Hyperlipidemia   Hematuria   Morbid obesity (HCC)  Acute and chronic back pain s/p laminectomy/decompression/fixation L2-L5 Per primary team   Fevers without overt source or symptoms of infection  Blood cultures negative  UA negative  WBC normal, lactic acid normal  Hold antibiotics for now  May be secondary to post-op and acute illness  Stop tylenol to treat fevers and watch fever curve. If continuing to have fevers, would check CXR, repeat blood cultures and urinalysis, as well as bilateral doppler US to r/o DVT   DM type 2, controlled, without complication  Ha1c 8/22: 6.9 Sensitive SSI   Hypomagnesemia Resolved  Hypokalemia Replace today Monitor BMP   Acute blood loss anemia and anemia of critical illness Stable   Transfuse for Hgb < 7   HTN Secondary to pain  Improved  Pain control   HLD  Hold med tx for now until oral intake improved  Hematuria Transient post-op Monitor   AKI post-op Volume loss related Resolved  Morbid obesity  Body mass index is 41.27 kg/m   DVT prophylaxis: SCDs Code Status: Full Family Communication: family not present during exam.  Disposition Plan: Per primary   Subjective: Patient doing well this morning. She is sitting in chair, eating breakfast. She states that her back pain is 7/10. She admits to some intermittent fevers and chills but denies any headache, chest pain, cough, shortness of breath, abdominal pain, nausea, vomiting, diarrhea, skin sores/ulcers, leg pain or swelling.   Objective: Vitals:   12/01/15 2100 12/02/15 0029 12/02/15 0500 12/02/15 0848  BP: (!) 145/75 133/67 (!) 121/98 115/63  Pulse: 87 83 93 86  Resp: 20 18 20 18   Temp: 98.9 F (37.2 C) 99.2 F (37.3 C) 98.6 F (37 C) 99 F (37.2 C)  TempSrc: Oral Axillary Oral Oral  SpO2: 98% 96% 100% 98%  Weight:      Height:        Intake/Output Summary (Last 24 hours) at 12/02/15 0930 Last data filed at 12/02/15 0924  Gross per 24 hour  Intake              120 ml  Output                0 ml  Net              120 ml  Filed Weights   11/26/15 0628  Weight: 112.5 kg (248 lb)    Examination:  General exam: Appears calm and comfortable  Respiratory system: Clear to auscultation. Respiratory effort normal. Cardiovascular system: S1 & S2 heard, RRR. No JVD, murmurs, rubs, gallops or clicks. No pedal edema. Gastrointestinal system: Abdomen is nondistended, soft and nontender. No organomegaly or masses felt. Normal bowel sounds heard. Central nervous system: Alert and oriented. No focal neurological deficits. Skin: No rashes, lesions or ulcers Psychiatry: Judgement and insight appear normal. Mood & affect appropriate.   Data Reviewed: I have personally reviewed following labs  and imaging studies  CBC:  Recent Labs Lab 11/28/15 0547  11/29/15 0540 11/29/15 1130 11/30/15 0500 11/30/15 1445 12/01/15 0400 12/02/15 0612  WBC 10.3  < > 9.9 8.2 9.3 10.2 8.3 8.6  NEUTROABS 8.2*  --  7.5  --  6.8  --   --  5.6  HGB 7.8*  < > 7.0* 8.1* 6.8* 8.3* 8.1* 8.2*  HCT 23.7*  < > 21.6* 25.3* 21.5* 24.9* 24.9* 25.2*  MCV 90.8  < > 91.1 91.0 90.0 88.9 89.2 89.7  PLT 142*  < > 137* 129* 190 185 215 242  < > = values in this interval not displayed. Basic Metabolic Panel:  Recent Labs Lab 11/28/15 0547 11/28/15 1719 11/29/15 0540 11/30/15 0500 12/01/15 0400 12/02/15 0612  NA 137 136 137 135 137 137  K 3.6 3.6 3.6 3.5 3.3* 3.1*  CL 108 105 103 100* 100* 100*  CO2 22 25 26 28 30 31   GLUCOSE 127* 129* 121* 129* 135* 142*  BUN 13 10 9 9 10 8   CREATININE 1.51* 1.35* 1.14* 0.99 0.97 0.94  CALCIUM 7.9* 8.3* 8.1* 8.5* 8.7* 8.6*  MG 1.5*  --  2.2 2.1  --  1.8  PHOS 2.6  --  2.4* 2.7  --   --    GFR: Estimated Creatinine Clearance: 77.6 mL/min (by C-G formula based on SCr of 0.94 mg/dL). Liver Function Tests:  Recent Labs Lab 12/01/15 0400 12/02/15 0612  AST 58* 54*  ALT 51 69*  ALKPHOS 124 134*  BILITOT 1.3* 1.0  PROT 5.7* 5.7*  ALBUMIN 2.8* 2.8*   No results for input(s): LIPASE, AMYLASE in the last 168 hours. No results for input(s): AMMONIA in the last 168 hours. Coagulation Profile:  Recent Labs Lab 11/27/15 0756  INR 1.15   Cardiac Enzymes:  Recent Labs Lab 11/28/15 0547 11/28/15 1050 11/29/15 0540 11/30/15 0615  TROPONINI 0.07* 0.04* 0.04* 0.05*   BNP (last 3 results) No results for input(s): PROBNP in the last 8760 hours. HbA1C: No results for input(s): HGBA1C in the last 72 hours. CBG:  Recent Labs Lab 12/01/15 0637 12/01/15 1143 12/01/15 1651 12/01/15 2135 12/02/15 0618  GLUCAP 148* 113* 119* 134* 125*   Lipid Profile: No results for input(s): CHOL, HDL, LDLCALC, TRIG, CHOLHDL, LDLDIRECT in the last 72 hours. Thyroid  Function Tests: No results for input(s): TSH, T4TOTAL, FREET4, T3FREE, THYROIDAB in the last 72 hours. Anemia Panel: No results for input(s): VITAMINB12, FOLATE, FERRITIN, TIBC, IRON, RETICCTPCT in the last 72 hours. Sepsis Labs:  Recent Labs Lab 11/27/15 1556 11/28/15 0547 11/28/15 1050 11/29/15 0540 11/30/15 0500 12/02/15 1610  PROCALCITON  --   --  0.69 0.59 0.45  --   LATICACIDVEN 0.8 1.0  --   --  0.8 0.6    Recent Results (from the past 240 hour(s))  Culture, blood (Routine X 2) w Reflex to ID Panel  Status: None (Preliminary result)   Collection Time: 11/28/15 10:50 AM  Result Value Ref Range Status   Specimen Description BLOOD RIGHT ARM  Final   Special Requests   Final    BOTTLES DRAWN AEROBIC AND ANAEROBIC 5CC BLUE 4CC RED   Culture NO GROWTH 3 DAYS  Final   Report Status PENDING  Incomplete  Culture, blood (Routine X 2) w Reflex to ID Panel     Status: None (Preliminary result)   Collection Time: 11/28/15 10:55 AM  Result Value Ref Range Status   Specimen Description BLOOD LEFT HAND  Final   Special Requests IN PEDIATRIC BOTTLE 0.5CC  Final   Culture NO GROWTH 3 DAYS  Final   Report Status PENDING  Incomplete  Culture, Urine     Status: None   Collection Time: 11/29/15 11:32 AM  Result Value Ref Range Status   Specimen Description URINE, CATHETERIZED  Final   Special Requests Normal  Final   Culture NO GROWTH  Final   Report Status 11/30/2015 FINAL  Final         Radiology Studies: No results found.      Scheduled Meds: . docusate sodium  100 mg Oral BID  . insulin aspart  0-5 Units Subcutaneous QHS  . insulin aspart  0-9 Units Subcutaneous TID WC  . pantoprazole  20 mg Oral Daily  . potassium chloride  40 mEq Oral BID  . senna  1 tablet Oral BID   Continuous Infusions: . sodium chloride 10 mL/hr at 11/30/15 1352     LOS: 6 days    Time spent: 30 minutes     Jordan Hawks, DO [facility] Pager  [phone]  If 7PM-7AM, please contact night-coverage [email or web address] Password [email or web address] 12/02/2015, 9:30 AM

## 2015-12-03 LAB — CBC WITH DIFFERENTIAL/PLATELET
BASOS PCT: 0 %
Basophils Absolute: 0 10*3/uL (ref 0.0–0.1)
EOS ABS: 0.3 10*3/uL (ref 0.0–0.7)
EOS PCT: 3 %
HCT: 25.9 % — ABNORMAL LOW (ref 36.0–46.0)
HEMOGLOBIN: 8.7 g/dL — AB (ref 12.0–15.0)
LYMPHS ABS: 1.7 10*3/uL (ref 0.7–4.0)
Lymphocytes Relative: 19 %
MCH: 30.3 pg (ref 26.0–34.0)
MCHC: 33.6 g/dL (ref 30.0–36.0)
MCV: 90.2 fL (ref 78.0–100.0)
Monocytes Absolute: 0.9 10*3/uL (ref 0.1–1.0)
Monocytes Relative: 10 %
NEUTROS PCT: 68 %
Neutro Abs: 6.1 10*3/uL (ref 1.7–7.7)
PLATELETS: 291 10*3/uL (ref 150–400)
RBC: 2.87 MIL/uL — AB (ref 3.87–5.11)
RDW: 13.3 % (ref 11.5–15.5)
WBC: 9 10*3/uL (ref 4.0–10.5)

## 2015-12-03 LAB — BASIC METABOLIC PANEL
Anion gap: 7 (ref 5–15)
BUN: 6 mg/dL (ref 6–20)
CHLORIDE: 100 mmol/L — AB (ref 101–111)
CO2: 30 mmol/L (ref 22–32)
Calcium: 8.8 mg/dL — ABNORMAL LOW (ref 8.9–10.3)
Creatinine, Ser: 0.92 mg/dL (ref 0.44–1.00)
GFR calc Af Amer: >60 mL/min (ref >60)
Glucose, Bld: 115 mg/dL — ABNORMAL HIGH (ref 65–99)
POTASSIUM: 3.6 mmol/L (ref 3.5–5.1)
Sodium: 137 mmol/L (ref 135–145)

## 2015-12-03 LAB — GLUCOSE, CAPILLARY
GLUCOSE-CAPILLARY: 106 mg/dL — AB (ref 65–99)
Glucose-Capillary: 107 mg/dL — ABNORMAL HIGH (ref 65–99)
Glucose-Capillary: 103 mg/dL — ABNORMAL HIGH (ref 65–99)
Glucose-Capillary: 128 mg/dL — ABNORMAL HIGH (ref 65–99)

## 2015-12-03 LAB — CULTURE, BLOOD (ROUTINE X 2)
CULTURE: NO GROWTH
Culture: NO GROWTH

## 2015-12-03 LAB — MAGNESIUM: Magnesium: 1.7 mg/dL (ref 1.7–2.4)

## 2015-12-03 MED ORDER — LISINOPRIL 20 MG PO TABS
40.0000 mg | ORAL_TABLET | Freq: Every day | ORAL | Status: DC
  Administered 2015-12-03 – 2015-12-04 (×2): 40 mg via ORAL
  Filled 2015-12-03 (×2): qty 2

## 2015-12-03 MED ORDER — MAGNESIUM OXIDE 400 (241.3 MG) MG PO TABS
400.0000 mg | ORAL_TABLET | Freq: Once | ORAL | Status: AC
  Administered 2015-12-03: 400 mg via ORAL
  Filled 2015-12-03: qty 1

## 2015-12-03 MED ORDER — POTASSIUM CHLORIDE CRYS ER 20 MEQ PO TBCR
40.0000 meq | EXTENDED_RELEASE_TABLET | Freq: Once | ORAL | Status: AC
  Administered 2015-12-03: 40 meq via ORAL
  Filled 2015-12-03: qty 2

## 2015-12-03 MED ORDER — SIMVASTATIN 40 MG PO TABS
40.0000 mg | ORAL_TABLET | Freq: Every day | ORAL | Status: DC
  Administered 2015-12-03: 40 mg via ORAL
  Filled 2015-12-03: qty 1

## 2015-12-03 NOTE — Progress Notes (Signed)
Patient ID: Kathleen SpearingLillie M Zheng, female   DOB: 03/13/1951, 65 y.o.   MRN: [id number] Vital signs are stable Patient's pain coming under better control Incision is clean and dry Patient may shower Awaiting placement in skilled nursing facility for further rehabilitation

## 2015-12-03 NOTE — Progress Notes (Signed)
PROGRESS NOTE    Kathleen Bishop  ZOX:[id number] DOB: [birth year] DOA: 11/26/2015 PCP: Abran Richard, PA-C     Brief Narrative:  Kathleen Bishop is a 65 year old BF with past medical hx of DM Type 2 controlled with complication, HTN, HLD, chronic back pain. She presented to [facility] 8/29 for elective laminectomy L2-L3-L4 with decompression L2-L5, posterior lumbar interbody arthrodesis, segmental fixation L2 L5 and posterior lateral arthrodesis L2-L5. Surgery was complicated by development of hematuria followed by poor urine output. She also developed rash during the case, which resolved with the administration of Benadryl. She was successfully extubated postoperatively and transferred to the ICU for monitoring. Estimated blood loss for the procedure was 750 mL's, 200 of which was returned to the patient via Cell Saver. TRH consulted for multiple medical problems.   Assessment & Plan:   Active Problems:   Lumbar stenosis   Acute kidney injury (HCC)   Acute post-operative pain   Controlled diabetes mellitus type 2 with complications (HCC)   Hypokalemia   Hypomagnesemia   Postoperative anemia due to acute blood loss   Fever, unspecified   Hypertension   Hyperlipidemia   Hematuria   Morbid obesity (HCC)  Acute and chronic back pain s/p laminectomy/decompression/fixation L2-L5 Per primary team   Fevers without overt source or symptoms of infection  Blood cultures negative  UA negative  WBC normal, lactic acid normal  Hold antibiotics for now  May be secondary to post-op and acute illness  Stop tylenol to treat fevers and watch fever curve. If continuing to have fevers, would check CXR, repeat blood cultures and urinalysis, as well as bilateral doppler US to r/o DVT   DM type 2, controlled, without complication  Ha1c 8/22: 6.9 Sensitive SSI   Hypomagnesemia Replaced   Hypokalemia Replace  Monitor BMP   Acute blood loss anemia and anemia of critical illness Stable    Transfuse for Hgb < 7   Essential HTN Restart lisinopril today   HLD  Restart zocor today   Hematuria Transient post-op Monitor   AKI post-op Volume loss related Resolved  Morbid obesity  Body mass index is 41.27 kg/m   DVT prophylaxis: SCDs Code Status: Full Family Communication: family not present during exam Disposition Plan: Per primary, SNF   Subjective: Patient doing well this morning. She is sitting in chair, getting cleaned up. She is still having significant back pain. She states that she did not have a good rest overnight due to pain. She hadn't had any fevers overnight, denies any headache, chest pain, cough, shortness of breath, abdominal pain, nausea, vomiting, diarrhea, skin sores/ulcers, leg pain or swelling.   Objective: Vitals:   12/02/15 2057 12/03/15 0047 12/03/15 0450 12/03/15 0940  BP: (!) 143/94 (!) 156/73 (!) 163/76 (!) 154/84  Pulse: 91 85 86 (!) 106  Resp: 20 20 20 18   Temp: 98.4 F (36.9 C) 99.3 F (37.4 C) 98.5 F (36.9 C) 98.4 F (36.9 C)  TempSrc: Oral Oral Oral Oral  SpO2: 100% 100% 100% 100%  Weight:      Height:        Intake/Output Summary (Last 24 hours) at 12/03/15 0946 Last data filed at 12/02/15 1800  Gross per 24 hour  Intake              840 ml  Output                0 ml  Net  840 ml   Filed Weights   11/26/15 0628  Weight: 112.5 kg (248 lb)    Examination:  General exam: Appears calm and comfortable  Respiratory system: Clear to auscultation. Respiratory effort normal. Cardiovascular system: S1 & S2 heard, RRR. No JVD, murmurs, rubs, gallops or clicks. No pedal edema. Gastrointestinal system: Abdomen is nondistended, soft and nontender. No organomegaly or masses felt. Normal bowel sounds heard. Central nervous system: Alert and oriented. No focal neurological deficits. Skin: No rashes, lesions or ulcers Psychiatry: Judgement and insight appear normal. Mood & affect appropriate.   Data Reviewed: I  have personally reviewed following labs and imaging studies  CBC:  Recent Labs Lab 11/28/15 0547  11/29/15 0540  11/30/15 0500 11/30/15 1445 12/01/15 0400 12/02/15 0612 12/03/15 0415  WBC 10.3  < > 9.9  < > 9.3 10.2 8.3 8.6 9.0  NEUTROABS 8.2*  --  7.5  --  6.8  --   --  5.6 6.1  HGB 7.8*  < > 7.0*  < > 6.8* 8.3* 8.1* 8.2* 8.7*  HCT 23.7*  < > 21.6*  < > 21.5* 24.9* 24.9* 25.2* 25.9*  MCV 90.8  < > 91.1  < > 90.0 88.9 89.2 89.7 90.2  PLT 142*  < > 137*  < > 190 185 215 242 291  < > = values in this interval not displayed. Basic Metabolic Panel:  Recent Labs Lab 11/28/15 0547  11/29/15 0540 11/30/15 0500 12/01/15 0400 12/02/15 0612 12/03/15 0415  NA 137  < > 137 135 137 137 137  K 3.6  < > 3.6 3.5 3.3* 3.1* 3.6  CL 108  < > 103 100* 100* 100* 100*  CO2 22  < > 26 28 30 31 30   GLUCOSE 127*  < > 121* 129* 135* 142* 115*  BUN 13  < > 9 9 10 8 6   CREATININE 1.51*  < > 1.14* 0.99 0.97 0.94 0.92  CALCIUM 7.9*  < > 8.1* 8.5* 8.7* 8.6* 8.8*  MG 1.5*  --  2.2 2.1  --  1.8 1.7  PHOS 2.6  --  2.4* 2.7  --   --   --   < > = values in this interval not displayed. GFR: Estimated Creatinine Clearance: 79.3 mL/min (by C-G formula based on SCr of 0.92 mg/dL). Liver Function Tests:  Recent Labs Lab 12/01/15 0400 12/02/15 0612  AST 58* 54*  ALT 51 69*  ALKPHOS 124 134*  BILITOT 1.3* 1.0  PROT 5.7* 5.7*  ALBUMIN 2.8* 2.8*   No results for input(s): LIPASE, AMYLASE in the last 168 hours. No results for input(s): AMMONIA in the last 168 hours. Coagulation Profile:  Recent Labs Lab 11/27/15 0756  INR 1.15   Cardiac Enzymes:  Recent Labs Lab 11/28/15 0547 11/28/15 1050 11/29/15 0540 11/30/15 0615  TROPONINI 0.07* 0.04* 0.04* 0.05*   BNP (last 3 results) No results for input(s): PROBNP in the last 8760 hours. HbA1C: No results for input(s): HGBA1C in the last 72 hours. CBG:  Recent Labs Lab 12/02/15 0618 12/02/15 1154 12/02/15 1705 12/02/15 2055  12/03/15 0622  GLUCAP 125* 123* 83 128* 107*   Lipid Profile: No results for input(s): CHOL, HDL, LDLCALC, TRIG, CHOLHDL, LDLDIRECT in the last 72 hours. Thyroid Function Tests: No results for input(s): TSH, T4TOTAL, FREET4, T3FREE, THYROIDAB in the last 72 hours. Anemia Panel: No results for input(s): VITAMINB12, FOLATE, FERRITIN, TIBC, IRON, RETICCTPCT in the last 72 hours. Sepsis Labs:  Recent Labs Lab 11/27/15  1556 11/28/15 0547 11/28/15 1050 11/29/15 0540 11/30/15 0500 12/02/15 0613  PROCALCITON  --   --  0.69 0.59 0.45  --   LATICACIDVEN 0.8 1.0  --   --  0.8 0.6    Recent Results (from the past 240 hour(s))  Culture, blood (Routine X 2) w Reflex to ID Panel     Status: None (Preliminary result)   Collection Time: 11/28/15 10:50 AM  Result Value Ref Range Status   Specimen Description BLOOD RIGHT ARM  Final   Special Requests   Final    BOTTLES DRAWN AEROBIC AND ANAEROBIC 5CC BLUE 4CC RED   Culture NO GROWTH 4 DAYS  Final   Report Status PENDING  Incomplete  Culture, blood (Routine X 2) w Reflex to ID Panel     Status: None (Preliminary result)   Collection Time: 11/28/15 10:55 AM  Result Value Ref Range Status   Specimen Description BLOOD LEFT HAND  Final   Special Requests IN PEDIATRIC BOTTLE 0.5CC  Final   Culture NO GROWTH 4 DAYS  Final   Report Status PENDING  Incomplete  Culture, Urine     Status: None   Collection Time: 11/29/15 11:32 AM  Result Value Ref Range Status   Specimen Description URINE, CATHETERIZED  Final   Special Requests Normal  Final   Culture NO GROWTH  Final   Report Status 11/30/2015 FINAL  Final         Radiology Studies: No results found.      Scheduled Meds: . docusate sodium  100 mg Oral BID  . insulin aspart  0-5 Units Subcutaneous QHS  . insulin aspart  0-9 Units Subcutaneous TID WC  . lisinopril  40 mg Oral Daily  . magnesium oxide  400 mg Oral Once  . pantoprazole  20 mg Oral Daily  . potassium chloride  40 mEq  Oral Once  . senna  1 tablet Oral BID  . simvastatin  40 mg Oral QHS   Continuous Infusions: . sodium chloride 10 mL/hr at 11/30/15 1352     LOS: 7 days    Time spent: 30 minutes     Jordan HawksJennifer Chahn-Yang Marvell Tamer, DO [facility] Pager [phone]  If 7PM-7AM, please contact night-coverage [email or web address] Password [email or web address] 12/03/2015, 9:46 AM

## 2015-12-03 NOTE — Progress Notes (Signed)
Physical Therapy Treatment Patient Details Name: Kathleen Bishop MRN: [id number] DOB: 07-10-1950 Today's Date: 12/03/2015    History of Present Illness 64 year old female past medical history significant for diabetes, hypertension, hyperlipidemia, and chronic back pain, HA, and Bil THA.  Pt now s/p L2-5 decompression and fusion.      PT Comments    Patient is progressing toward mobility goals and tolerated increased gait distance with no increase in pain. Continue to recommend ST-SNF for further skilled PT services to maximize independence and safety with mobility and be at a mod I level before d/c home.   Follow Up Recommendations  SNF;Supervision/Assistance - 24 hour     Equipment Recommendations  3in1 (PT)    Recommendations for Other Services       Precautions / Restrictions Precautions Precautions: Fall;Back Precaution Comments: pt recalled 2/3 precautions; reviewed all precautions Required Braces or Orthoses: Spinal Brace Spinal Brace: Lumbar corset Restrictions Weight Bearing Restrictions: No    Mobility  Bed Mobility Overal bed mobility: Needs Assistance Bed Mobility: Sit to Sidelying;Rolling Rolling: Supervision     Sit to supine: Supervision   General bed mobility comments: OOB in chair upon arrival; cues for technique and maintaining back precautions; increased time needed but no physical assist  Transfers Overall transfer level: Needs assistance Equipment used: Rolling walker (2 wheeled) Transfers: Sit to/from Stand Sit to Stand: Supervision         General transfer comment: supervision for safety; cues for safe hand placement  Ambulation/Gait Ambulation/Gait assistance: Supervision Ambulation Distance (Feet): 150 Feet Assistive device: Rolling walker (2 wheeled) Gait Pattern/deviations: Step-through pattern;Decreased stride length Gait velocity: decreased   General Gait Details: slow, steady gait; pt with improved upright posture and foot  clearance this session   Stairs            Wheelchair Mobility    Modified Rankin (Stroke Patients Only)       Balance     Sitting balance-Leahy Scale: Good     Standing balance support: Single extremity supported Standing balance-Leahy Scale: Fair                      Cognition Arousal/Alertness: Awake/alert Behavior During Therapy: WFL for tasks assessed/performed Overall Cognitive Status: Within Functional Limits for tasks assessed                      Exercises      General Comments        Pertinent Vitals/Pain Pain Assessment: 0-10 Pain Score: 7  Pain Location: back and R LE Pain Descriptors / Indicators: Aching;Sore;Guarding Pain Intervention(s): Limited activity within patient's tolerance;Monitored during session;Repositioned;Patient requesting pain meds-RN notified    Home Living                      Prior Function            PT Goals (current goals can now be found in the care plan section) Acute Rehab PT Goals Patient Stated Goal: rehab before home PT Goal Formulation: With patient Time For Goal Achievement: 12/11/15 Potential to Achieve Goals: Good Progress towards PT goals: Progressing toward goals    Frequency  Min 5X/week    PT Plan Current plan remains appropriate    Co-evaluation             End of Session Equipment Utilized During Treatment: Gait belt;Back brace Activity Tolerance: Patient tolerated treatment well Patient left: with call bell/phone within reach;in  bed;with bed alarm set;with SCD's reapplied     Time: 0939-1006 PT Time Calculation (min) (ACUTE ONLY): 27 min  Charges:  $Gait Training: 8-22 mins $Therapeutic Activity: 8-22 mins                    G Codes:      Derek MoundKellyn R Kyran Whittier Shah Insley, PTA Pager: [phone]   12/03/2015, 10:17 AM

## 2015-12-03 NOTE — Clinical Social Work Note (Signed)
CSW met with pt for bd choice. Facilties in [place] did not extend bed offers and pt chose [facility]. CSW updated facility and Worker's Comp prior [place]. CSW will continue to follow.   Darden Dates, MSW, LCSW  Clinical Social Worker [phone]

## 2015-12-03 NOTE — Progress Notes (Signed)
Occupational Therapy Treatment Patient Details Name: Kathleen Bishop MRN: [id number] DOB: 12/19/1950 Today's Date: 12/03/2015    History of present illness 65 year old female past medical history significant for diabetes, hypertension, hyperlipidemia, and chronic back pain, HA, and Bil THA.  Pt now s/p L2-5 decompression and fusion.     OT comments  Progressing well towards OT goals. Continue OT per plan of care.  Follow Up Recommendations  SNF;Supervision/Assistance - 24 hour    Equipment Recommendations  Other (comment) (TBD at next venue)    Recommendations for Other Services      Precautions / Restrictions Precautions Precautions: Fall;Back Precaution Comments: pt recalled 2/3 precautions; reviewed all precautions Required Braces or Orthoses: Spinal Brace Spinal Brace: Lumbar corset Restrictions Weight Bearing Restrictions: No       Mobility Bed Mobility             Transfers                 Balance                                ADL Overall ADL's : Needs assistance/impaired Eating/Feeding: Set up;Sitting       Upper Body Bathing: Minimal assitance;Sitting   Lower Body Bathing: Minimal assistance;Adhering to back precautions;Sit to/from stand   Upper Body Dressing : Moderate assistance;Sitting Upper Body Dressing Details (indicate cue type and reason): don brace Lower Body Dressing: Moderate assistance;Adhering to back precautions;Sit to/from stand Lower Body Dressing Details (indicate cue type and reason): would benefit from AE training for LB self-care               General ADL Comments: Reviewed back precautions; pt only recalling 2/3. She is having difficulty donning her brace. Discussed ADL implications for back precautions. Would benefit from AE training for LB self-care. Patient limited by pain. Did not wish to practice toileting task but states she is going with nursing staff without much difficulty. Discussed no  twisting during toilet hygiene.       Vision                     Perception     Praxis      Cognition   Behavior During Therapy: WFL for tasks assessed/performed Overall Cognitive Status: Within Functional Limits for tasks assessed       Memory: Decreased recall of precautions               Extremity/Trunk Assessment               Exercises     Shoulder Instructions       General Comments      Pertinent Vitals/ Pain       Pain Assessment: 0-10 Pain Score: 7  Pain Location: back, R leg Pain Descriptors / Indicators: Aching;Sore Pain Intervention(s): Limited activity within patient's tolerance;Premedicated before session  Home Living                                          Prior Functioning/Environment              Frequency Min 2X/week     Progress Toward Goals  OT Goals(current goals can now be found in the care plan section)  Progress towards OT goals: Progressing toward goals  Acute Rehab OT Goals  Patient Stated Goal: rehab before home  Plan Discharge plan remains appropriate    Co-evaluation                 End of Session Equipment Utilized During Treatment: Back brace;Rolling walker   Activity Tolerance Patient tolerated treatment well   Patient Left in bed;with call bell/phone within reach;with bed alarm set   Nurse Communication          Time: [id number] OT Time Calculation (min): 12 min  Charges: OT General Charges $OT Visit: 1 Procedure OT Treatments $Self Care/Home Management : 8-22 mins  Kathleen Bishop A 12/03/2015, 11:03 AM

## 2015-12-04 LAB — CBC WITH DIFFERENTIAL/PLATELET
BASOS ABS: 0 10*3/uL (ref 0.0–0.1)
BASOS PCT: 0 %
EOS ABS: 0.2 10*3/uL (ref 0.0–0.7)
EOS PCT: 2 %
HCT: 27.8 % — ABNORMAL LOW (ref 36.0–46.0)
Hemoglobin: 8.9 g/dL — ABNORMAL LOW (ref 12.0–15.0)
Lymphocytes Relative: 20 %
Lymphs Abs: 1.9 10*3/uL (ref 0.7–4.0)
MCH: 29.1 pg (ref 26.0–34.0)
MCHC: 32 g/dL (ref 30.0–36.0)
MCV: 90.8 fL (ref 78.0–100.0)
MONO ABS: 0.9 10*3/uL (ref 0.1–1.0)
Monocytes Relative: 9 %
Neutro Abs: 6.7 10*3/uL (ref 1.7–7.7)
Neutrophils Relative %: 69 %
PLATELETS: 331 10*3/uL (ref 150–400)
RBC: 3.06 MIL/uL — ABNORMAL LOW (ref 3.87–5.11)
RDW: 13.6 % (ref 11.5–15.5)
WBC: 9.8 10*3/uL (ref 4.0–10.5)

## 2015-12-04 LAB — GLUCOSE, CAPILLARY
GLUCOSE-CAPILLARY: 110 mg/dL — AB (ref 65–99)
GLUCOSE-CAPILLARY: 99 mg/dL (ref 65–99)
GLUCOSE-CAPILLARY: 117 mg/dL — AB (ref 65–99)

## 2015-12-04 LAB — BASIC METABOLIC PANEL
ANION GAP: 8 (ref 5–15)
BUN: 9 mg/dL (ref 6–20)
CHLORIDE: 101 mmol/L (ref 101–111)
CO2: 27 mmol/L (ref 22–32)
Calcium: 8.9 mg/dL (ref 8.9–10.3)
Creatinine, Ser: 0.89 mg/dL (ref 0.44–1.00)
GFR calc Af Amer: >60 mL/min (ref >60)
Glucose, Bld: 131 mg/dL — ABNORMAL HIGH (ref 65–99)
POTASSIUM: 3.7 mmol/L (ref 3.5–5.1)
SODIUM: 136 mmol/L (ref 135–145)

## 2015-12-04 LAB — MAGNESIUM: MAGNESIUM: 1.8 mg/dL (ref 1.7–2.4)

## 2015-12-04 MED ORDER — OXYCODONE-ACETAMINOPHEN 5-325 MG PO TABS: 1.0000 | ORAL_TABLET | ORAL | 0 refills | Status: DC | PRN

## 2015-12-04 MED ORDER — DIAZEPAM 5 MG PO TABS: 5.0000 mg | ORAL_TABLET | Freq: Four times a day (QID) | ORAL | 0 refills | Status: DC | PRN

## 2015-12-04 NOTE — Progress Notes (Signed)
PROGRESS NOTE    Kathleen Bishop  ZOX:[id number] DOB: [birth year] DOA: 11/26/2015 PCP: Abran Richard, PA-C     Brief Narrative:  Kathleen Bishop is a 65 year old BF with past medical hx of DM Type 2 controlled with complication, HTN, HLD, chronic back pain. She presented to [facility] 8/29 for elective laminectomy L2-L3-L4 with decompression L2-L5, posterior lumbar interbody arthrodesis, segmental fixation L2 L5 and posterior lateral arthrodesis L2-L5. Surgery was complicated by development of hematuria followed by poor urine output. She also developed rash during the case, which resolved with the administration of Benadryl. She was successfully extubated postoperatively and transferred to the ICU for monitoring. Estimated blood loss for the procedure was 750 mL's, 200 of which was returned to the patient via Cell Saver. TRH consulted for multiple medical problems.   Assessment & Plan:   Active Problems:   Lumbar stenosis   Acute kidney injury (HCC)   Acute post-operative pain   Controlled diabetes mellitus type 2 with complications (HCC)   Hypokalemia   Hypomagnesemia   Postoperative anemia due to acute blood loss   Fever, unspecified   Hypertension   Hyperlipidemia   Hematuria   Morbid obesity (HCC)  Acute and chronic back pain s/p laminectomy/decompression/fixation L2-L5 Per primary team   Fevers without overt source or symptoms of infection: Seems to be resolved.  - Will sign off. Ok to discharge from medical standpoint.  - If continuing to have fevers, would check CXR, repeat blood cultures and urinalysis, as well as bilateral doppler US to r/o DVT   DM type 2, controlled, without complication  Ha1c 8/22: 6.9 Sensitive SSI   Hypomagnesemia Replaced   Hypokalemia Replace  Monitor BMP   Acute blood loss anemia and anemia of critical illness Stable  Transfuse for Hgb < 7   Essential HTN Restarted lisinopril  HLD  Restarted zocor  Hematuria Transient  post-op Monitor   AKI post-op Volume loss related Resolved  Morbid obesity  Body mass index is 41.27 kg/m  DVT prophylaxis: SCDs Code Status: Full Family Communication: family not present during exam Disposition Plan: Per primary, SNF   Subjective: Patient doing well this morning. Still with pain but denies fevers, dyspnea, palpitations, chest pain, N/V/D, dysuria, leg swelling.   Objective: Vitals:   12/04/15 0522 12/04/15 0920 12/04/15 1029 12/04/15 1453  BP: (!) 116/51 123/66 (!) 151/76 (!) 154/69  Pulse: 87 77 98 93  Resp: 20  17 14   Temp: 98.6 F (37 C)  99.5 F (37.5 C) 99.5 F (37.5 C)  TempSrc: Oral  Oral Oral  SpO2: 100%  98% 100%  Weight:      Height:        Intake/Output Summary (Last 24 hours) at 12/04/15 1705 Last data filed at 12/04/15 1027  Gross per 24 hour  Intake              130 ml  Output                0 ml  Net              130 ml   Filed Weights   11/26/15 0628  Weight: 112.5 kg (248 lb)    Examination:  General exam: Appears calm and comfortable  Respiratory system: Clear to auscultation. Respiratory effort normal. Cardiovascular system: S1 & S2 heard, RRR. No JVD, murmurs, rubs, gallops or clicks. No pedal edema. Gastrointestinal system: Abdomen is nondistended, soft and nontender. No organomegaly or masses felt. Normal  bowel sounds heard. Central nervous system: Alert and oriented. No focal neurological deficits. Skin: No rashes, lesions or ulcers Psychiatry: Judgement and insight appear normal. Mood & affect appropriate.   Data Reviewed: I have personally reviewed following labs and imaging studies  CBC:  Recent Labs Lab 11/29/15 0540  11/30/15 0500 11/30/15 1445 12/01/15 0400 12/02/15 0612 12/03/15 0415 12/04/15 1026  WBC 9.9  < > 9.3 10.2 8.3 8.6 9.0 9.8  NEUTROABS 7.5  --  6.8  --   --  5.6 6.1 6.7  HGB 7.0*  < > 6.8* 8.3* 8.1* 8.2* 8.7* 8.9*  HCT 21.6*  < > 21.5* 24.9* 24.9* 25.2* 25.9* 27.8*  MCV 91.1  < > 90.0  88.9 89.2 89.7 90.2 90.8  PLT 137*  < > 190 185 215 242 291 331  < > = values in this interval not displayed. Basic Metabolic Panel:  Recent Labs Lab 11/28/15 0547  11/29/15 0540 11/30/15 0500 12/01/15 0400 12/02/15 0612 12/03/15 0415 12/04/15 1026  NA 137  < > 137 135 137 137 137 136  K 3.6  < > 3.6 3.5 3.3* 3.1* 3.6 3.7  CL 108  < > 103 100* 100* 100* 100* 101  CO2 22  < > 26 28 30 31 30 27   GLUCOSE 127*  < > 121* 129* 135* 142* 115* 131*  BUN 13  < > 9 9 10 8 6 9   CREATININE 1.51*  < > 1.14* 0.99 0.97 0.94 0.92 0.89  CALCIUM 7.9*  < > 8.1* 8.5* 8.7* 8.6* 8.8* 8.9  MG 1.5*  --  2.2 2.1  --  1.8 1.7 1.8  PHOS 2.6  --  2.4* 2.7  --   --   --   --   < > = values in this interval not displayed. GFR: Estimated Creatinine Clearance: 81.9 mL/min (by C-G formula based on SCr of 0.89 mg/dL). Liver Function Tests:  Recent Labs Lab 12/01/15 0400 12/02/15 0612  AST 58* 54*  ALT 51 69*  ALKPHOS 124 134*  BILITOT 1.3* 1.0  PROT 5.7* 5.7*  ALBUMIN 2.8* 2.8*   No results for input(s): LIPASE, AMYLASE in the last 168 hours. No results for input(s): AMMONIA in the last 168 hours. Coagulation Profile: No results for input(s): INR, PROTIME in the last 168 hours. Cardiac Enzymes:  Recent Labs Lab 11/28/15 0547 11/28/15 1050 11/29/15 0540 11/30/15 0615  TROPONINI 0.07* 0.04* 0.04* 0.05*   BNP (last 3 results) No results for input(s): PROBNP in the last 8760 hours. HbA1C: No results for input(s): HGBA1C in the last 72 hours. CBG:  Recent Labs Lab 12/03/15 1650 12/03/15 2200 12/04/15 0615 12/04/15 1128 12/04/15 1625  GLUCAP 103* 128* 110* 99 117*   Lipid Profile: No results for input(s): CHOL, HDL, LDLCALC, TRIG, CHOLHDL, LDLDIRECT in the last 72 hours. Thyroid Function Tests: No results for input(s): TSH, T4TOTAL, FREET4, T3FREE, THYROIDAB in the last 72 hours. Anemia Panel: No results for input(s): VITAMINB12, FOLATE, FERRITIN, TIBC, IRON, RETICCTPCT in the last  72 hours. Sepsis Labs:  Recent Labs Lab 11/28/15 0547 11/28/15 1050 11/29/15 0540 11/30/15 0500 12/02/15 1610  PROCALCITON  --  0.69 0.59 0.45  --   LATICACIDVEN 1.0  --   --  0.8 0.6    Recent Results (from the past 240 hour(s))  Culture, blood (Routine X 2) w Reflex to ID Panel     Status: None   Collection Time: 11/28/15 10:50 AM  Result Value Ref Range Status  Specimen Description BLOOD RIGHT ARM  Final   Special Requests   Final    BOTTLES DRAWN AEROBIC AND ANAEROBIC 5CC BLUE 4CC RED   Culture NO GROWTH 5 DAYS  Final   Report Status 12/03/2015 FINAL  Final  Culture, blood (Routine X 2) w Reflex to ID Panel     Status: None   Collection Time: 11/28/15 10:55 AM  Result Value Ref Range Status   Specimen Description BLOOD LEFT HAND  Final   Special Requests IN PEDIATRIC BOTTLE 0.5CC  Final   Culture NO GROWTH 5 DAYS  Final   Report Status 12/03/2015 FINAL  Final  Culture, Urine     Status: None   Collection Time: 11/29/15 11:32 AM  Result Value Ref Range Status   Specimen Description URINE, CATHETERIZED  Final   Special Requests Normal  Final   Culture NO GROWTH  Final   Report Status 11/30/2015 FINAL  Final         Radiology Studies: No results found.      Scheduled Meds: . docusate sodium  100 mg Oral BID  . insulin aspart  0-5 Units Subcutaneous QHS  . insulin aspart  0-9 Units Subcutaneous TID WC  . lisinopril  40 mg Oral Daily  . pantoprazole  20 mg Oral Daily  . senna  1 tablet Oral BID  . simvastatin  40 mg Oral QHS   Continuous Infusions: . sodium chloride 10 mL/hr at 12/04/15 1307     LOS: 8 days    Time spent: 30 minutes   Hazeline Junkeryan Kailene Steinhart, MD [facility] Pager [phone]  If 7PM-7AM, please contact night-coverage [email or web address] Password [email or web address] 12/04/2015, 5:05 PM

## 2015-12-04 NOTE — Progress Notes (Signed)
Pt being discharged to Skilled Nursing Facililty (SNF) per orders from MD. Pt aware of transfer. Pt's PICC line was removed prior to transfer. Pt exited hospital via stretcher.

## 2015-12-04 NOTE — Clinical Social Work Placement (Signed)
   CLINICAL SOCIAL WORK PLACEMENT  NOTE  Date:  12/04/2015  Patient Details  Name: Kathleen SpearingLillie M Honaker MRN: [id number] Date of Birth: [birth year]  Clinical Social Work is seeking post-discharge placement for this patient at the Skilled  Nursing Facility level of care (*CSW will initial, date and re-position this form in  chart as items are completed):  Yes   Patient/family provided with [place] Clinical Social Work Department's list of facilities offering this level of care within the geographic area requested by the patient (or if unable, by the patient's family).  Yes   Patient/family informed of their freedom to choose among providers that offer the needed level of care, that participate in Medicare, Medicaid or managed care program needed by the patient, have an available bed and are willing to accept the patient.  Yes   Patient/family informed of [facility]'s ownership interest in [facility] and [facility], as well as of the fact that they are under no obligation to receive care at these facilities.  PASRR submitted to EDS on 11/27/15     PASRR number received on 11/27/15     Existing PASRR number confirmed on       FL2 transmitted to all facilities in geographic area requested by pt/family on 11/27/15     FL2 transmitted to all facilities within larger geographic area on 11/27/15     Patient informed that his/her managed care company has contracts with or will negotiate with certain facilities, including the following:        Yes   Patient/family informed of bed offers received.  [facility]     Physician recommends and patient chooses bed at      [facility] on 12/04/15.  Patient to be transferred to facility by [facility]     Patient family notified on 12/04/15 of transfer.  Name of family member notified:  Pt     PHYSICIAN Please sign FL2, Please prepare priority discharge summary, including  medications, Please prepare prescriptions     Additional Comment:    _______________________________________________ Dede QuerySarah Trevar Boehringer, LCSW 12/04/2015, 5:43 PM

## 2015-12-04 NOTE — Progress Notes (Signed)
Physical Therapy Treatment Patient Details Name: Kathleen Bishop MRN: [id number] DOB: 09-Nov-1950 Today's Date: 12/04/2015    History of Present Illness 65 year old female past medical history significant for diabetes, hypertension, hyperlipidemia, and chronic back pain, HA, and Bil THA.  Pt now s/p L2-5 decompression and fusion.      PT Comments    Patient limited by increased R LE pain today but agreeable to therapy. Min A for bed mobility this session. Current plan remains appropriate.   Follow Up Recommendations  SNF;Supervision/Assistance - 24 hour     Equipment Recommendations  3in1 (PT)    Recommendations for Other Services       Precautions / Restrictions Precautions Precautions: Fall;Back Precaution Comments: reviewed all precautions Required Braces or Orthoses: Spinal Brace Spinal Brace: Lumbar corset Restrictions Weight Bearing Restrictions: No    Mobility  Bed Mobility Overal bed mobility: Needs Assistance Bed Mobility: Sit to Sidelying;Rolling;Sidelying to Sit Rolling: Supervision Sidelying to sit: Min assist     Sit to sidelying: Min assist General bed mobility comments: assist needed with bilat LE in and out of bed and cues for sequencing/technique  Transfers Overall transfer level: Needs assistance Equipment used: Rolling walker (2 wheeled) Transfers: Sit to/from Stand Sit to Stand: Supervision         General transfer comment: supervision for safety; carry over of safe hand placement with pt correcting self  Ambulation/Gait Ambulation/Gait assistance: Supervision Ambulation Distance (Feet): 100 Feet Assistive device: Rolling walker (2 wheeled) Gait Pattern/deviations: Step-through pattern;Decreased stride length Gait velocity: decreased   General Gait Details: decreased cadence from yesterday and increased R LE pain compared to previous session; cues for posture   Stairs            Wheelchair Mobility    Modified Rankin (Stroke  Patients Only)       Balance     Sitting balance-Leahy Scale: Good       Standing balance-Leahy Scale: Fair                      Cognition Arousal/Alertness: Awake/alert Behavior During Therapy: WFL for tasks assessed/performed Overall Cognitive Status: Within Functional Limits for tasks assessed                      Exercises      General Comments        Pertinent Vitals/Pain Pain Assessment: 0-10 Pain Score: 8  Pain Location: R LE Pain Descriptors / Indicators: Aching;Burning Pain Intervention(s): Limited activity within patient's tolerance;Monitored during session;Premedicated before session;Repositioned;Heat applied    Home Living                      Prior Function            PT Goals (current goals can now be found in the care plan section) Acute Rehab PT Goals Patient Stated Goal: rehab before home PT Goal Formulation: With patient Time For Goal Achievement: 12/11/15 Potential to Achieve Goals: Good Progress towards PT goals: Progressing toward goals    Frequency  Min 5X/week    PT Plan Current plan remains appropriate    Co-evaluation             End of Session Equipment Utilized During Treatment: Gait belt;Back brace Activity Tolerance: Patient tolerated treatment well Patient left: with call bell/phone within reach;in bed;with SCD's reapplied     Time: 0931-1005 PT Time Calculation (min) (ACUTE ONLY): 34 min  Charges:  $Gait  Training: 8-22 mins $Therapeutic Activity: 8-22 mins                    G Codes:      Kathleen Bishop, PTA Pager: [phone]   12/04/2015, 10:14 AM

## 2015-12-04 NOTE — Discharge Summary (Signed)
Physician Discharge Summary  Patient ID: Kathleen SpearingLillie M Bishop MRN: [id number] DOB/AGE: 08/11/1950 65 y.o.  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 Diabetes Mellitus type II. Morbid Obesity. Acute blood loss anemia  Active Problems:   Lumbar stenosis   Acute kidney injury (HCC)   Acute post-operative pain   Controlled diabetes mellitus type 2 with complications (HCC)   Hypokalemia   Hypomagnesemia   Postoperative anemia due to acute blood loss   Fever, unspecified   Hypertension   Hyperlipidemia   Hematuria   Morbid obesity (HCC)   Discharged Condition: fair  Hospital Course: Patient was admitted for surgical decompresion and stabilization L2 to L5.Marland Kitchen. Post operatively she had findings of ABLA and acute Kidney injury.She was slow to mobilize. Her condition gradually improved, but she will require further inpatient rehabilitation to build her strength and independence.  Consults: Critical Care and Hospitalist  Significant Diagnostic Studies: none  Treatments: surgery: Decompresion and fusion L2 to L5  Discharge Exam: Blood pressure (!) 154/69, pulse 93, temperature 99.5 F (37.5 C), temperature source Oral, resp. rate 14, height 5\' 5"  (1.651 m), weight 112.5 kg (248 lb), SpO2 100 %. incision is clean and dry. Motor function is intact  Disposition: Transfer to SNF  Discharge Instructions    Call MD for:  redness, tenderness, or signs of infection (pain, swelling, redness, odor or green/yellow discharge around incision site)    Complete by:  As directed   Call MD for:  severe uncontrolled pain    Complete by:  As directed   Call MD for:  temperature >100.4    Complete by:  As directed   Diet - low sodium heart healthy    Complete by:  As directed   Increase activity  slowly    Complete by:  As directed       Medication List    TAKE these medications   diazepam 5 MG tablet Commonly known as:  VALIUM Take 1 tablet (5 mg total) by mouth every 6 (six) hours as needed for muscle spasms.   ibuprofen 200 MG tablet Commonly known as:  ADVIL,MOTRIN Take 200 mg by mouth every 6 (six) hours as needed for moderate pain.   lisinopril 40 MG tablet Commonly known as:  PRINIVIL,ZESTRIL Take 40 mg by mouth daily.   metFORMIN 500 MG tablet Commonly known as:  GLUCOPHAGE Take 500 mg by mouth 2 (two) times daily with a meal.   oxyCODONE-acetaminophen 5-325 MG tablet Commonly known as:  PERCOCET/ROXICET Take 1-2 tablets by mouth every 4 (four) hours as needed for moderate pain.   simvastatin 40 MG tablet Commonly known as:  ZOCOR Take 40 mg by mouth at bedtime.   VITAMIN D PO Take 1 capsule by mouth daily.      Contact information for after-discharge care    Destination    [facility] .   Specialty:  Skilled Nursing Facility Contact information: [address] [place] [id number] [phone]              Signed: Stefani DamaLSNER,Brienne Liguori J 12/04/2015, 4:00 PM

## 2015-12-04 NOTE — Clinical Social Work Note (Signed)
RN Report Information [facility]  Report # [id number] Room # 400 Hall  Pt is ready for discharge today. Workers Comp Berkley Harveyauth has been obtained. Facility is able to accept pt as they have received discharge information. CSW updated pt regarding discharge. RN will call report. [facility] will provide transportation. CSW is signing off as no further needs identified.   Dede QuerySarah Keshanna Riso, MSW, LCSW  Clinical Social Worker [phone]

## 2015-12-04 NOTE — Progress Notes (Signed)
Contacted Elsner, MD and inquired about necessity for PICC to still be present in patient. MD stated PICC can be removed and that he would be coming by to check on patient later today.

## 2017-07-12 ENCOUNTER — Ambulatory Visit: Payer: BLUE CROSS/BLUE SHIELD | Admitting: Orthopedic Surgery

## 2017-07-12 ENCOUNTER — Ambulatory Visit (INDEPENDENT_AMBULATORY_CARE_PROVIDER_SITE_OTHER): Payer: BLUE CROSS/BLUE SHIELD

## 2017-07-12 ENCOUNTER — Encounter: Payer: Self-pay | Admitting: Orthopedic Surgery

## 2017-07-12 VITALS — BP 173/106 | HR 69 | Ht 64.0 in | Wt 231.0 lb

## 2017-07-12 DIAGNOSIS — M25511 Pain in right shoulder: Secondary | ICD-10-CM

## 2017-07-12 DIAGNOSIS — M7501 Adhesive capsulitis of right shoulder: Secondary | ICD-10-CM

## 2017-07-12 DIAGNOSIS — G8929 Other chronic pain: Secondary | ICD-10-CM

## 2017-07-12 NOTE — Addendum Note (Signed)
Addended byCaffie Damme: Rafe Mackowski W on: 07/12/2017 04:19 PM   Modules accepted: Orders

## 2017-07-12 NOTE — Patient Instructions (Signed)
Adhesive Capsulitis Adhesive capsulitis is inflammation of the tendons and ligaments that surround the shoulder joint (shoulder capsule). This condition causes the shoulder to become stiff and painful to move. Adhesive capsulitis is also called frozen shoulder. What are the causes? This condition may be caused by:  An injury to the shoulder joint.  Straining the shoulder.  Not moving the shoulder for a period of time. This can happen if your arm was injured or in a sling.  Long-standing health problems, such as: ? Diabetes. ? Thyroid problems. ? Heart disease. ? Stroke. ? Rheumatoid arthritis. ? Lung disease.  In some cases, the cause may not be known. What increases the risk? This condition is more likely to develop in:  Women.  People who are older than 65 years of age.  What are the signs or symptoms? Symptoms of this condition include:  Pain in the shoulder when moving the arm. There may also be pain when parts of the shoulder are touched. The pain is worse at night or when at rest.  Soreness or aching in the shoulder.  Inability to move the shoulder normally.  Muscle spasms.  How is this diagnosed? This condition is diagnosed with a physical exam and imaging tests, such as an X-ray or MRI. How is this treated? This condition may be treated with:  Treatment of the underlying cause or condition.  Physical therapy. This involves performing exercises to get the shoulder moving again.  Medicine. Medicine may be given to relieve pain, inflammation, or muscle spasms.  Steroid injections into the shoulder joint.  Shoulder manipulation. This is a procedure to move the shoulder into another position. It is done after you are given a medicine to make you fall asleep (general anesthetic). The joint may also be injected with salt water at high pressure to break down scarring.  Surgery. This may be done in severe cases when other treatments have failed.  Although most  people recover completely from adhesive capsulitis, some may not regain the full movement of the shoulder. Follow these instructions at home:  Take over-the-counter and prescription medicines only as told by your health care provider.  If you are being treated with physical therapy, follow instructions from your physical therapist.  Avoid exercises that put a lot of demand on your shoulder, such as throwing. These exercises can make pain worse.  If directed, apply ice to the injured area: ? Put ice in a plastic bag. ? Place a towel between your skin and the bag. ? Leave the ice on for 20 minutes, 2-3 times per day. Contact a health care provider if:  You develop new symptoms.  Your symptoms get worse. This information is not intended to replace advice given to you by your health care provider. Make sure you discuss any questions you have with your health care provider. Document Released: 01/11/2009 Document Revised: 08/22/2015 Document Reviewed: 07/09/2014 Elsevier Interactive Patient Education  2018 [employer]  

## 2017-07-12 NOTE — Progress Notes (Signed)
NEW PATIENT OFFICE VISIT   Chief Complaint  Patient presents with  . Shoulder Pain    right shoulder pain since Aug 2017 woke up from surgery lumbar fusion with right shoulder painfil     67 yo female 1.5 yrs s/p lumbar fusion presents for evaluation of right shoulder pain  She says she had a fusion back in 2017 and wants the surgery was done she noticed right shoulder pain progressive loss of motion with progressively increasing dull aching pain including pain at night, denies any trauma numbness or tingling of the right arm   Review of Systems  Constitutional: Negative for chills and fever.  Musculoskeletal: Positive for back pain.  Skin: Negative for rash.     Past Medical History:  Diagnosis Date  . Chronic back pain    spondylolisthesis  . Diabetes mellitus without complication (HCC)    takes Metformin daily   . Eczema   . Headache    occasionally  . Hyperlipidemia    takes Simvastatin nightly  . Hypertension    takes Lisinopril daily  . Weakness    in hands and feet occasionally    Past Surgical History:  Procedure Laterality Date  . ABDOMINAL HYSTERECTOMY     partial  . BACK SURGERY     lumbar fusion 2017  . JOINT REPLACEMENT Bilateral    hip  . SPINAL FUSION      History reviewed. No pertinent family history. Social History   Tobacco Use  . Smoking status: Never Smoker  . Smokeless tobacco: Never Used  Substance Use Topics  . Alcohol use: No  . Drug use: No    Current Meds  Medication Sig  . Cholecalciferol (VITAMIN D PO) Take 1 capsule by mouth daily.  Marland Kitchen gabapentin (NEURONTIN) 300 MG capsule Take 300 mg by mouth 2 (two) times daily.   . hydrochlorothiazide (HYDRODIURIL) 25 MG tablet Take 25 mg by mouth daily.  Marland Kitchen HYDROcodone-acetaminophen (NORCO/VICODIN) 5-325 MG tablet Take 1 tablet by mouth every 6 (six) hours as needed for moderate pain.  Marland Kitchen ibuprofen (ADVIL,MOTRIN) 200 MG tablet Take 200 mg by mouth every 6 (six) hours as needed for  moderate pain.  Marland Kitchen lisinopril (PRINIVIL,ZESTRIL) 40 MG tablet Take 40 mg by mouth daily.  . metFORMIN (GLUCOPHAGE) 500 MG tablet Take 500 mg by mouth 2 (two) times daily with a meal.  . simvastatin (ZOCOR) 40 MG tablet Take 40 mg by mouth at bedtime.    BP (!) 173/106   Pulse 69   Ht 5\' 4"  (1.626 m)   Wt 231 lb (104.8 kg)   BMI 39.65 kg/m   Physical Exam  Constitutional: She is oriented to person, place, and time. She appears well-developed and well-nourished.  Neurological: She is alert and oriented to person, place, and time.  Psychiatric: She has a normal mood and affect. Judgment normal.  Vitals reviewed.   Right Shoulder Exam   Tenderness  The patient is experiencing tenderness in the acromion (Diffuse tenderness about the proximal shoulder).  Range of Motion  Active abduction: abnormal  Passive abduction: abnormal  Extension: abnormal  External rotation: abnormal  Forward flexion: abnormal   Muscle Strength  The patient has normal right shoulder strength.  Tests  Apprehension: negative Impingement: positive Sulcus: absent  Other  Erythema: absent Sensation: normal Pulse: present   Left Shoulder Exam  Left shoulder exam is normal.  Tenderness  The patient is experiencing no tenderness.   Range of Motion  The patient has normal  left shoulder ROM.  Muscle Strength  The patient has normal left shoulder strength.  Tests  Apprehension: negative  Other  Erythema: absent Sensation: normal Pulse: present        MEDICAL DECISION SECTION  xrays ordered?  Yes  My independent reading of xrays: See dictated report summation: NORMAL SHOULDER    Encounter Diagnoses  Name Primary?  . Chronic right shoulder pain Yes  . Adhesive capsulitis of right shoulder      PLAN:    Injection?  No MRI/CT/?  No  Loss of external rotation right to left indicates adhesive capsulitis  Recommend aggressive physical therapy as needed to improve range of  motion follow-up every 12 weeks until resolution patient aware may take up to a year

## 2017-08-17 IMAGING — RF DG C-ARM 61-120 MIN
1 series · 2 of 2 positions shown · non-contrast
Comparison: None.

CLINICAL DATA: PLIF of the L2-3, L3-4 and L4-5

EXAM:
DG C-ARM 61-120 MIN

[Series 1: run · 2 of 2 slices shown]
[im 1/2]
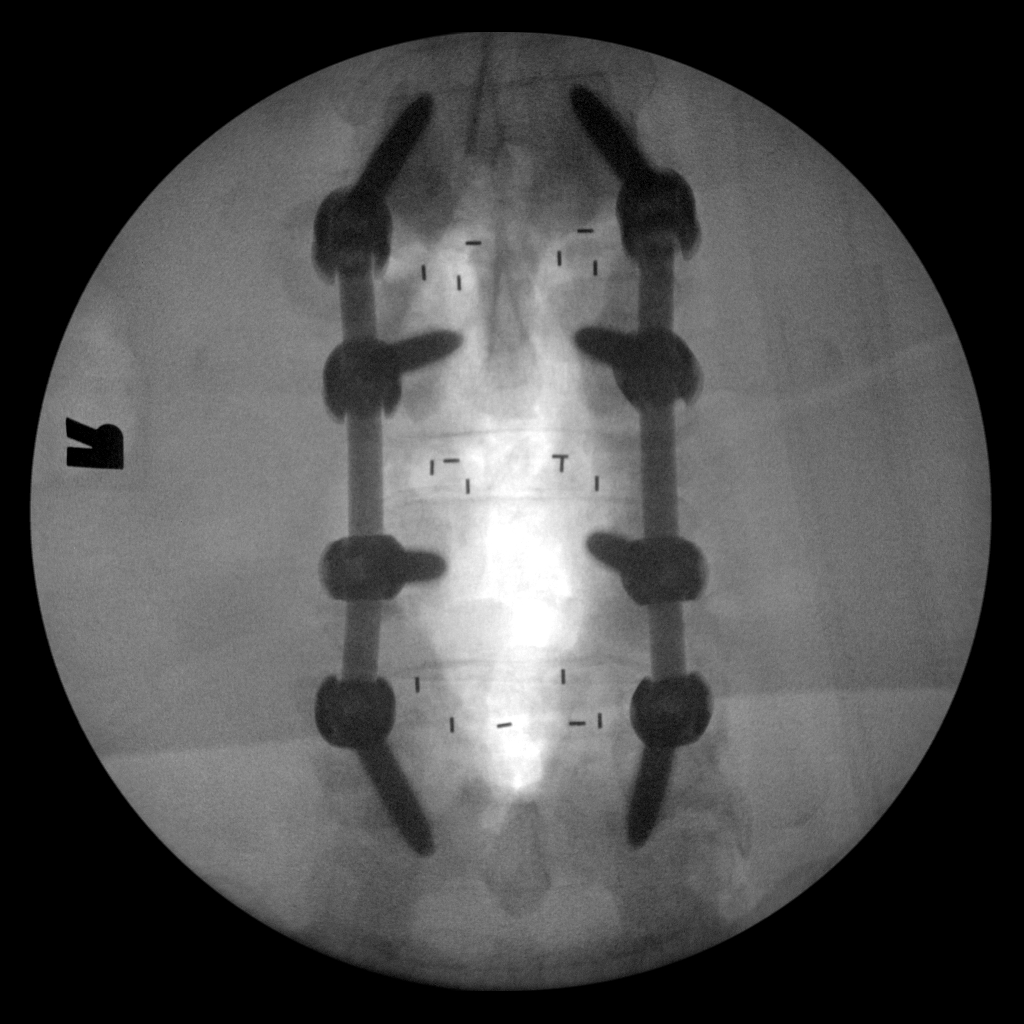
[im 2/2]
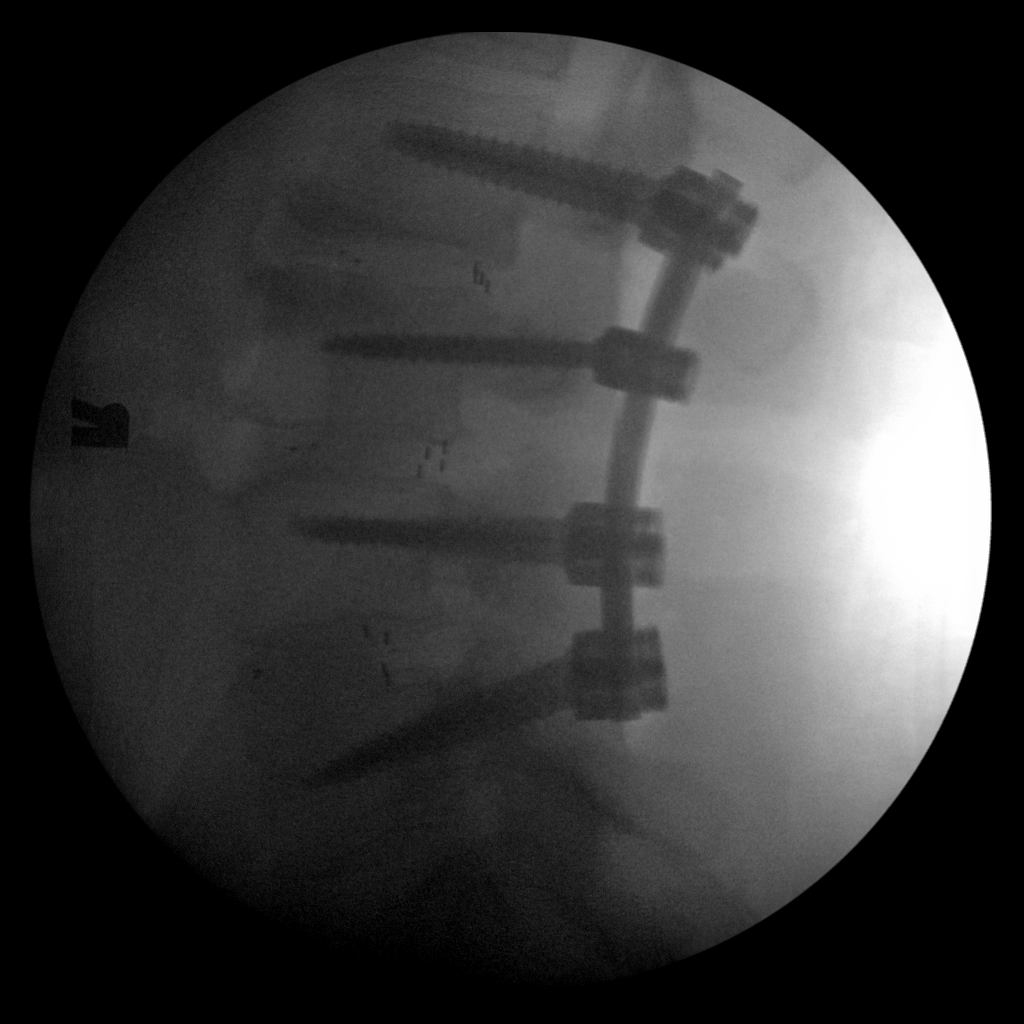

[2 of 2 positions shown; findings below may reference images not displayed]

FINDINGS: AP and lateral intraoperative fluoroscopic spot images show surgical
changes of central canal decompression and posterior intrapedicular
fixation hardware at the L2 through L5 levels, appropriately
positioned, with interposed disc spacers/cages at each level.
IMPRESSION: Intraoperative fluoroscopic spot images showing no evidence of
surgical complicating feature.

## 2017-08-17 IMAGING — CR DG LUMBAR SPINE 1V
1 series · 1 of 1 positions shown · non-contrast
Comparison: CT lumbar spine of 05/25/2014

CLINICAL DATA: Localization for lumbar spine surgery

EXAM:
LUMBAR SPINE - 1 VIEW

[lateral]
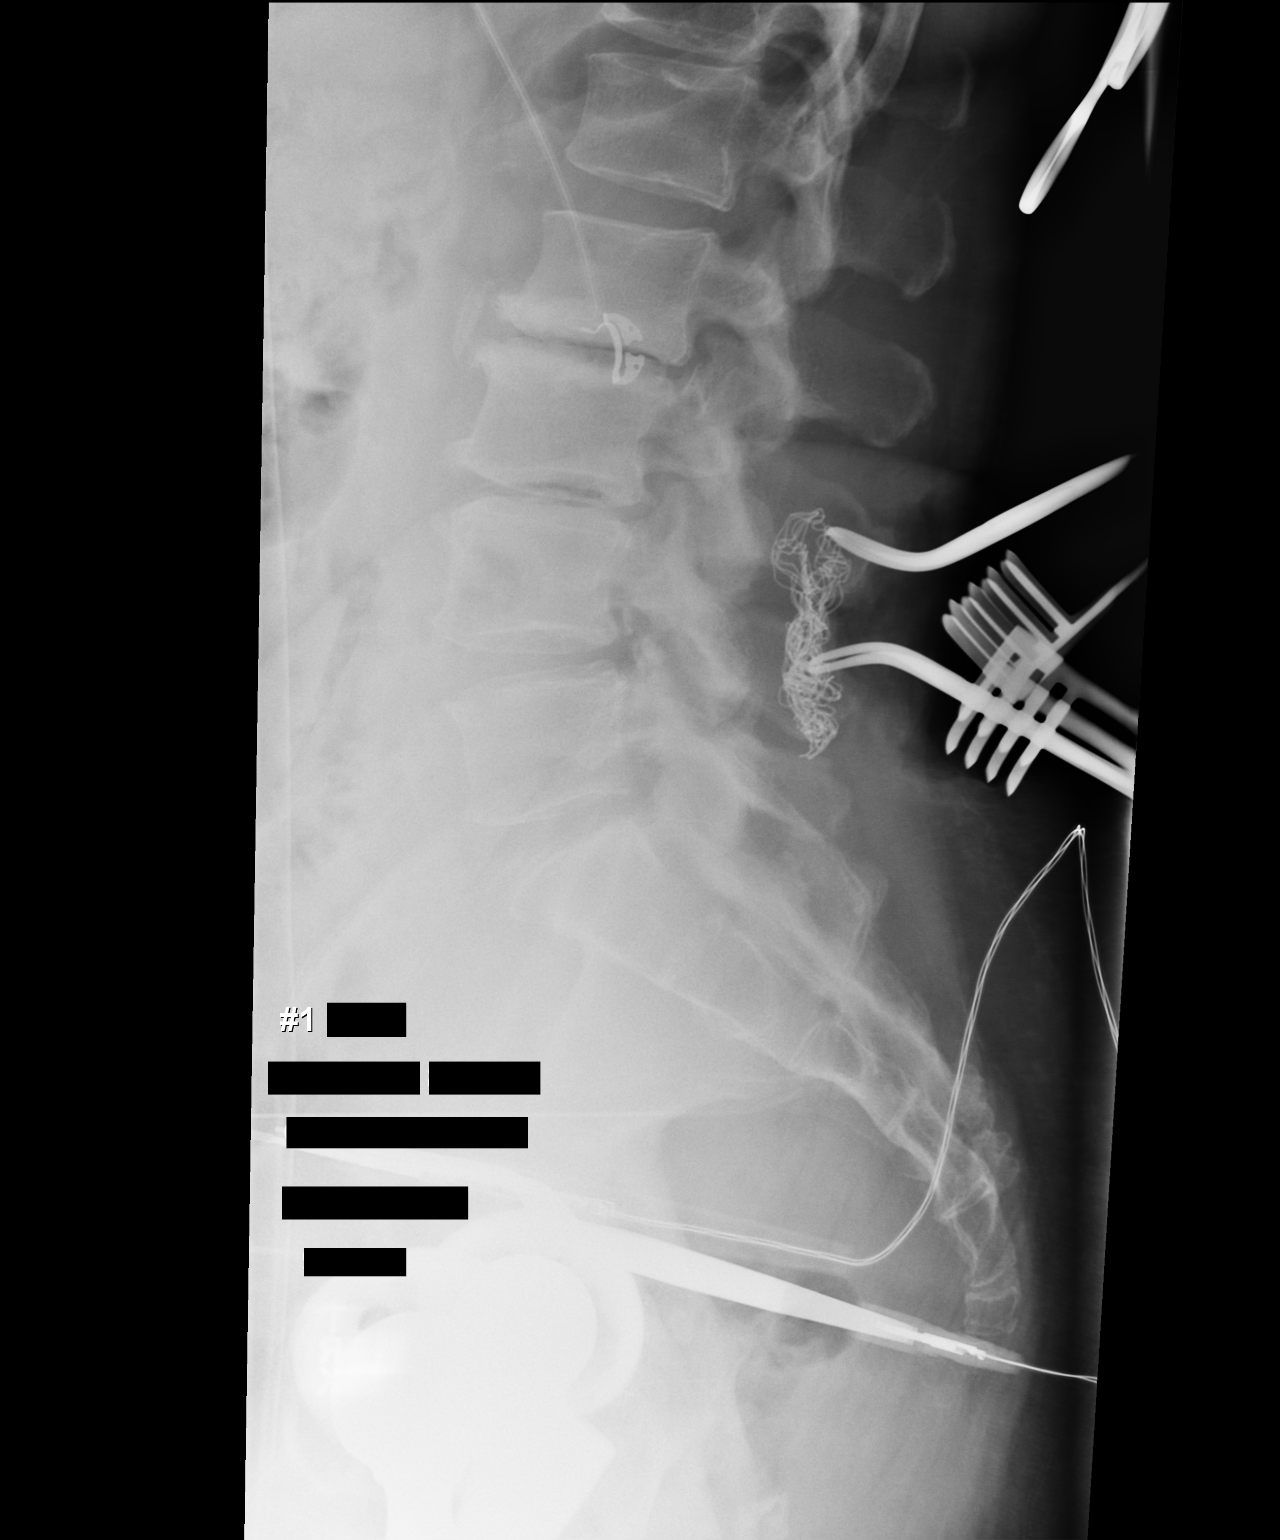

[1 of 1 positions shown; findings below may reference images not displayed]

FINDINGS: On this cross-table lateral portable view labeled 1, clamps are
present posteriorly upon the spinous processes of L3 and L4 for
localization purposes.
IMPRESSION: Clamps are noted posteriorly on the spinous processes of L3 and L4.

## 2017-10-13 ENCOUNTER — Encounter: Payer: Self-pay | Admitting: Orthopedic Surgery

## 2017-10-13 ENCOUNTER — Ambulatory Visit (INDEPENDENT_AMBULATORY_CARE_PROVIDER_SITE_OTHER): Payer: BLUE CROSS/BLUE SHIELD | Admitting: Orthopedic Surgery

## 2017-10-13 VITALS — BP 122/75 | HR 73 | Ht 64.0 in | Wt 229.0 lb

## 2017-10-13 DIAGNOSIS — M7501 Adhesive capsulitis of right shoulder: Secondary | ICD-10-CM | POA: Diagnosis not present

## 2017-10-13 NOTE — Patient Instructions (Signed)
Start using the ibuprofen again use a heating pad on your shoulder at night

## 2017-10-13 NOTE — Progress Notes (Signed)
Chief Complaint  Patient presents with  . Follow-up    R shoulder pain    67 year old female woke up after surgery with a tight shoulder diagnosis with adhesive capsulitis and she is been in therapy.  She complains of pain in the right shoulder especially at night but increased function  She now has 90 degrees passive abduction and 20 degrees external rotation arm at the side  Recommend resume ibuprofen and then come back after 3 more months of therapy  Encounter Diagnosis  Name Primary?  . Adhesive capsulitis of right shoulder Yes

## 2017-12-06 ENCOUNTER — Other Ambulatory Visit (HOSPITAL_COMMUNITY): Payer: Self-pay | Admitting: Internal Medicine

## 2017-12-06 DIAGNOSIS — Z1231 Encounter for screening mammogram for malignant neoplasm of breast: Secondary | ICD-10-CM

## 2017-12-13 ENCOUNTER — Encounter: Payer: Self-pay | Admitting: Nurse Practitioner

## 2018-01-12 ENCOUNTER — Ambulatory Visit: Payer: BLUE CROSS/BLUE SHIELD | Admitting: Orthopedic Surgery

## 2018-01-19 ENCOUNTER — Telehealth: Payer: Self-pay | Admitting: *Deleted

## 2018-01-19 ENCOUNTER — Other Ambulatory Visit: Payer: Self-pay | Admitting: *Deleted

## 2018-01-19 ENCOUNTER — Encounter: Payer: Self-pay | Admitting: Nurse Practitioner

## 2018-01-19 ENCOUNTER — Ambulatory Visit (INDEPENDENT_AMBULATORY_CARE_PROVIDER_SITE_OTHER): Payer: Medicare Other | Admitting: Nurse Practitioner

## 2018-01-19 ENCOUNTER — Encounter: Payer: Self-pay | Admitting: *Deleted

## 2018-01-19 DIAGNOSIS — Z1211 Encounter for screening for malignant neoplasm of colon: Secondary | ICD-10-CM

## 2018-01-19 DIAGNOSIS — R69 Illness, unspecified: Secondary | ICD-10-CM | POA: Diagnosis not present

## 2018-01-19 MED ORDER — CLENPIQ 10-3.5-12 MG-GM -GM/160ML PO SOLN: 1.0000 | Freq: Once | ORAL | 0 refills | Status: AC

## 2018-01-19 NOTE — Progress Notes (Signed)
CC'D TO PCP °

## 2018-01-19 NOTE — Telephone Encounter (Signed)
Pre-op scheduled for 04-05-18 at 10:00am. Patient aware. Letter mailed.

## 2018-01-19 NOTE — Patient Instructions (Signed)
1. We will schedule your colonoscopy for you. 2. Further recommendations will be made after your colonoscopy. 3. Return for follow-up based on the recommendations made after your colonoscopy. 4. Call us if you have any questions or concerns.  At [facility] we value your feedback. You may receive a survey about your visit today. Please share your experience as we strive to create trusting relationships with our patients to provide genuine, compassionate, quality care.  We appreciate your understanding and patience as we review any laboratory studies, imaging, and other diagnostic tests that are ordered as we care for you. Our office policy is 5 business days for review of these results, and any emergent or urgent results are addressed in a timely manner for your best interest. If you do not hear from our office in 1 week, please contact us.   We also encourage the use of MyChart, which contains your medical information for your review as well. If you are not enrolled in this feature, an access code is on this after visit summary for your convenience. Thank you for allowing us to be involved in your care.  It was great to meet you today!  I hope you have a great Fall!!    

## 2018-01-19 NOTE — Assessment & Plan Note (Signed)
The patient is overdue for first ever colonoscopy.  Generally asymptomatic from a GI standpoint.  She is on chronic pain medications and office visit was elected over phone triage due to polypharmacy.  At this time we will proceed with first ever colonoscopy.  Proceed with colonoscopy on propofol/MAC with Dr. Oneida Alar in the near future. The risks, benefits, and alternatives have been discussed in detail with the patient. They state understanding and desire to proceed.   The patient is currently on Valium, Neurontin, hydrocodone, Ultram.  No other anticoagulants, anxiolytics, chronic pain medications, or antidepressants.  We will plan for the procedure on propofol/MAC to promote adequate sedation.

## 2018-01-19 NOTE — Progress Notes (Addendum)
REVIEWED-NO ADDITIONAL RECOMMENDATIONS.  Primary Care Physician:  Alvina Filbert, MD Primary Gastroenterologist:  Dr. Darrick Penna  Chief Complaint  Patient presents with  . Colonoscopy    never had tcs    HPI:   Kathleen Bishop is a 67 y.o. female who presents on referral from primary care to schedule colonoscopy.  Nurse/phone triage was deferred to office visit due to medications.  Reviewed information provided with the referral including office visit dated 12/06/2017.  At that time as noted the patient is due for colon cancer screening and she was referred to GI.  No overt abdominal symptoms noted in the office visit.  No history of colonoscopy or endoscopy in our system.  Today she states she's doing ok overall. Never had a colonoscopy before. Denies abdominal pain, N/V, hematochezia, melena, fever, chills, unintentional weight loss. Has chronic pain s/p bilateral hip arthroplasty and back surgery. Denies chest pain, dyspnea, dizziness, lightheadedness, syncope, near syncope. Denies any other upper or lower GI symptoms.  No history of anesthesia problems. No sleep apnea.  Past Medical History:  Diagnosis Date  . Chronic back pain    spondylolisthesis  . Diabetes mellitus without complication (HCC)    takes Metformin daily   . Eczema   . Headache    occasionally  . Hyperlipidemia    takes Simvastatin nightly  . Hypertension    takes Lisinopril daily  . Weakness    in hands and feet occasionally    Past Surgical History:  Procedure Laterality Date  . ABDOMINAL HYSTERECTOMY     partial  . BACK SURGERY     lumbar fusion 2017  . JOINT REPLACEMENT Bilateral    hip  . SPINAL FUSION      Current Outpatient Medications  Medication Sig Dispense Refill  . Cholecalciferol (VITAMIN D PO) Take 1 capsule by mouth daily.    . diazepam (VALIUM) 5 MG tablet Take 1 tablet (5 mg total) by mouth every 6 (six) hours as needed for muscle spasms. 60 tablet 0  . gabapentin (NEURONTIN)  300 MG capsule Take 300 mg by mouth 2 (two) times daily.     . hydrochlorothiazide (HYDRODIURIL) 25 MG tablet Take 25 mg by mouth daily.    Marland Kitchen HYDROcodone-acetaminophen (NORCO/VICODIN) 5-325 MG tablet Take 1 tablet by mouth every 6 (six) hours as needed for moderate pain.    Marland Kitchen ibuprofen (ADVIL,MOTRIN) 200 MG tablet Take 200 mg by mouth every 6 (six) hours as needed for moderate pain.    Marland Kitchen lisinopril (PRINIVIL,ZESTRIL) 40 MG tablet Take 40 mg by mouth daily.    . metFORMIN (GLUCOPHAGE) 500 MG tablet Take 500 mg by mouth 2 (two) times daily with a meal.    . simvastatin (ZOCOR) 40 MG tablet Take 40 mg by mouth at bedtime.    . traMADol (ULTRAM) 50 MG tablet Take 50 mg by mouth as needed.   5   No current facility-administered medications for this visit.     Allergies as of 01/19/2018 - Review Complete 01/19/2018  Allergen Reaction Noted  . No known allergies  11/25/2015    Family History  Problem Relation Age of Onset  . Colon cancer Neg Hx   . Gastric cancer Neg Hx   . Esophageal cancer Neg Hx     Social History   Socioeconomic History  . Marital status: Divorced    Spouse name: Not on file  . Number of children: Not on file  . Years of education: Not on file  .  Highest education level: Not on file  Occupational History  . Not on file  Social Needs  . Financial resource strain: Not on file  . Food insecurity:    Worry: Not on file    Inability: Not on file  . Transportation needs:    Medical: Not on file    Non-medical: Not on file  Tobacco Use  . Smoking status: Never Smoker  . Smokeless tobacco: Never Used  Substance and Sexual Activity  . Alcohol use: No  . Drug use: No  . Sexual activity: Not on file  Lifestyle  . Physical activity:    Days per week: Not on file    Minutes per session: Not on file  . Stress: Not on file  Relationships  . Social connections:    Talks on phone: Not on file    Gets together: Not on file    Attends religious service: Not on  file    Active member of club or organization: Not on file    Attends meetings of clubs or organizations: Not on file    Relationship status: Not on file  . Intimate partner violence:    Fear of current or ex partner: Not on file    Emotionally abused: Not on file    Physically abused: Not on file    Forced sexual activity: Not on file  Other Topics Concern  . Not on file  Social History Narrative  . Not on file    Review of Systems: Complete ROS negative except as per HPI.    Physical Exam: BP 114/76   Pulse 65   Temp 98.3 F (36.8 C) (Oral)   Ht 5\' 5"  (1.651 m)   Wt 226 lb (102.5 kg)   BMI 37.61 kg/m  General:   Alert and oriented. Pleasant and cooperative. Well-nourished and well-developed.  Head:  Normocephalic and atraumatic. Eyes:  Without icterus, sclera clear and conjunctiva pink.  Ears:  Normal auditory acuity. Cardiovascular:  S1, S2 present without murmurs appreciated. Normal bilateral DP pulses noted. Extremities without clubbing or edema. Respiratory:  Clear to auscultation bilaterally. No wheezes, rales, or rhonchi. No distress.  Gastrointestinal:  +BS, soft, non-tender and non-distended. No HSM noted. No guarding or rebound. No masses appreciated.  Rectal:  Deferred  Musculoskalatal:  Symmetrical without gross deformities. Skin:  Intact without significant lesions or rashes. Neurologic:  Alert and oriented x4;  grossly normal neurologically. Psych:  Alert and cooperative. Normal mood and affect. Heme/Lymph/Immune: No excessive bruising noted.    01/19/2018 10:08 AM   Disclaimer: This note was dictated with voice recognition software. Similar sounding words can inadvertently be transcribed and may not be corrected upon review.

## 2018-01-31 ENCOUNTER — Ambulatory Visit: Payer: Medicare Other | Admitting: Orthopedic Surgery

## 2018-01-31 VITALS — BP 130/89 | HR 73 | Ht 65.0 in | Wt 224.0 lb

## 2018-01-31 DIAGNOSIS — M7501 Adhesive capsulitis of right shoulder: Secondary | ICD-10-CM

## 2018-01-31 DIAGNOSIS — G8929 Other chronic pain: Secondary | ICD-10-CM | POA: Diagnosis not present

## 2018-01-31 DIAGNOSIS — M25511 Pain in right shoulder: Secondary | ICD-10-CM

## 2018-01-31 NOTE — Progress Notes (Signed)
Chief Complaint  Patient presents with  . Follow-up    Recheck on right shoulder after PT.    67 year old female had right shoulder adhesive capsulitis went to therapy she says she is doing great with no pain  She exhibits full range of motion without pain or restriction in both shoulders  She does have some pain in her knee would like a referral which we will get at the appropriate time  Encounter Diagnoses  Name Primary?  . Adhesive capsulitis of right shoulder Yes  . Chronic right shoulder pain   ]  Follow-up as needed

## 2018-02-02 ENCOUNTER — Telehealth: Payer: Self-pay | Admitting: Gastroenterology

## 2018-02-02 MED ORDER — PEG 3350-KCL-NA BICARB-NACL 420 G PO SOLR: 4000.0000 mL | ORAL | 0 refills | Status: AC

## 2018-02-02 NOTE — Telephone Encounter (Signed)
Rx for Tri-Lyte sent to pharmacy. New instructions mailed. Called and informed pt. 

## 2018-02-02 NOTE — Telephone Encounter (Signed)
Pt said her insurance doesn't cover the prep that was called into [facility] in [place]. Please call something else in. [phone]

## 2018-02-09 ENCOUNTER — Ambulatory Visit (HOSPITAL_COMMUNITY)
Admission: RE | Admit: 2018-02-09 | Discharge: 2018-02-09 | Disposition: A | Discharge status: Home / Self Care | Payer: Medicare Other | Source: Ambulatory Visit | Attending: Internal Medicine | Admitting: Internal Medicine

## 2018-02-09 DIAGNOSIS — Z1231 Encounter for screening mammogram for malignant neoplasm of breast: Secondary | ICD-10-CM | POA: Diagnosis present

## 2018-03-29 NOTE — Patient Instructions (Signed)
Kathleen SpearingLillie M Bishop  03/29/2018     @PREFPERIOPPHARMACY @   Your procedure is scheduled on  04/12/2018 .  Report to [facility] at  700   A.M.  Call this number if you have problems the morning of surgery:  [phone]   Remember:  Follow the diet and prep instructions given to you by Dr Evelina DunField's office.                        Take these medicines the morning of surgery with A SIP OF WATER  Cymbalta, gabapentin, lisinopril.    Do not wear jewelry, make-up or nail polish.  Do not wear lotions, powders, or perfumes, or deodorant.  Do not shave 48 hours prior to surgery.  Men may shave face and neck.  Do not bring valuables to the hospital.  [facility] any belongings or valuables.  Contacts, dentures or bridgework may not be worn into surgery.  Leave your suitcase in the car.  After surgery it may be brought to your room.  For patients admitted to the hospital, discharge time will be determined by your treatment team.  Patients discharged the day of surgery will not be allowed to drive home.   Name and phone number of your driver:   family Special instructions:  DO NOT take any medications for diabetes the morning of your procedure.  Please read over the following fact sheets that you were given. Anesthesia Post-op Instructions and Care and Recovery After Surgery       Colonoscopy, Adult A colonoscopy is an exam to look at the large intestine. It is done to check for problems, such as:  Lumps (tumors).  Growths (polyps).  Swelling (inflammation).  Bleeding. What happens before the procedure? Eating and drinking Follow instructions from your doctor about eating and drinking. These instructions may include:  A few days before the procedure - follow a low-fiber diet. ? Avoid nuts. ? Avoid seeds. ? Avoid dried fruit. ? Avoid raw fruits. ? Avoid vegetables.  1-3 days before the procedure - follow a clear liquid diet. Avoid  liquids that have red or purple dye. Drink only clear liquids, such as: ? Clear broth or bouillon. ? Black coffee or tea. ? Clear juice. ? Clear soft drinks or sports drinks. ? Gelatin dessert. ? Popsicles.  On the day of the procedure - do not eat or drink anything during the 2 hours before the procedure. Up to 2 hours before the procedure, you may continue to drink clear liquids, such as water or clear fruit juice.  Bowel prep If you were prescribed an oral bowel prep:  Take it as told by your doctor. Starting the day before your procedure, you will need to drink a lot of liquid. The liquid will cause you to poop (have bowel movements) until your poop is almost clear or light green.  To clean out your colon, you may also be given: ? Laxative medicines. ? Instructions about how to use an enema.  If your skin or butt gets irritated from diarrhea, you may: ? Wipe the area with wipes that have medicine in them, such as adult wet wipes with aloe and vitamin E. ? Put something on your skin that soothes the area, such as petroleum jelly.  If you throw up (vomit) while drinking the bowel prep, take a break for up to 60 minutes. Then begin the  bowel prep again. If you keep throwing up and you cannot take the bowel prep without throwing up, call your doctor. General instructions  Ask your doctor about: ? Changing or stopping your normal medicines. This is important if you take iron pills, diabetes medicines, or blood thinners. ? Taking medicines such as aspirin and ibuprofen. These medicines can thin your blood. Do not take these medicines unless your doctor tells you to take them.  Plan to have someone take you home from the hospital or clinic. What happens during the procedure?   An IV tube may be put into one of your veins.  You will be given medicine to help you relax (sedative).  To reduce your risk of infection: ? Your doctors will wash their hands. ? Your anal area will be  washed with soap.  You will be asked to lie on your side with your knees bent.  Your doctor will get a long, thin, flexible tube ready. The tube will have a camera and a light on the end.  The tube will be put into your anus.  The tube will be gently put into your large intestine.  Air will be delivered into your large intestine to keep it open. You may feel some pressure or cramping.  The camera will be used to take photos.  A small tissue sample may be removed for testing (biopsy).  If small growths are found, your doctor may remove them and have them checked for cancer.  The tube that was put into your anus will be slowly removed. The procedure may vary among doctors and hospitals. What happens after the procedure?  Your doctor will check on you often until the medicines you were given have worn off.  Do not drive for 24 hours after the procedure.  You may have a small amount of blood in your poop.  You may pass gas.  You may have mild cramps or bloating in your belly (abdomen).  It is up to you to get the results of your procedure. Ask your doctor, or the department performing the procedure, when your results will be ready. Summary  A colonoscopy is an exam to look at the large intestine.  Follow instructions from your doctor about eating and drinking before the procedure.  If you were prescribed an oral bowel prep to clean out your colon, take it as told by your doctor.  Your doctor will check on you often until the medicines you were given have worn off.  Plan to have someone take you home from the hospital or clinic. This information is not intended to replace advice given to you by your health care provider. Make sure you discuss any questions you have with your health care provider. Document Released: 04/18/2010 Document Revised: 01/13/2017 Document Reviewed: 05/28/2015 Elsevier Interactive Patient Education  2019 [employer]  Colonoscopy, Adult, Care  After This sheet gives you information about how to care for yourself after your procedure. Your health care provider may also give you more specific instructions. If you have problems or questions, contact your health care provider. What can I expect after the procedure? After the procedure, it is common to have:  A small amount of blood in your stool for 24 hours after the procedure.  Some gas.  Mild abdominal cramping or bloating. Follow these instructions at home: General instructions  For the first 24 hours after the procedure: ? Do not drive or use machinery. ? Do not sign important documents. ? Do  not drink alcohol. ? Do your regular daily activities at a slower pace than normal. ? Eat soft, easy-to-digest foods.  Take over-the-counter or prescription medicines only as told by your health care provider. Relieving cramping and bloating   Try walking around when you have cramps or feel bloated.  Apply heat to your abdomen as told by your health care provider. Use a heat source that your health care provider recommends, such as a moist heat pack or a heating pad. ? Place a towel between your skin and the heat source. ? Leave the heat on for 20-30 minutes. ? Remove the heat if your skin turns bright red. This is especially important if you are unable to feel pain, heat, or cold. You may have a greater risk of getting burned. Eating and drinking   Drink enough fluid to keep your urine pale yellow.  Resume your normal diet as instructed by your health care provider. Avoid heavy or fried foods that are hard to digest.  Avoid drinking alcohol for as long as instructed by your health care provider. Contact a health care provider if:  You have blood in your stool 2-3 days after the procedure. Get help right away if:  You have more than a small spotting of blood in your stool.  You pass large blood clots in your stool.  Your abdomen is swollen.  You have nausea or  vomiting.  You have a fever.  You have increasing abdominal pain that is not relieved with medicine. Summary  After the procedure, it is common to have a small amount of blood in your stool. You may also have mild abdominal cramping and bloating.  For the first 24 hours after the procedure, do not drive or use machinery, sign important documents, or drink alcohol.  Contact your health care provider if you have a lot of blood in your stool, nausea or vomiting, a fever, or increased abdominal pain. This information is not intended to replace advice given to you by your health care provider. Make sure you discuss any questions you have with your health care provider. Document Released: 10/29/2003 Document Revised: 01/06/2017 Document Reviewed: 05/28/2015 Elsevier Interactive Patient Education  2019 [place] Anesthesia is a term that refers to techniques, procedures, and medicines that help a person stay safe and comfortable during a medical procedure. Monitored anesthesia care, or sedation, is one type of anesthesia. Your anesthesia specialist may recommend sedation if you will be having a procedure that does not require you to be unconscious, such as:  Cataract surgery.  A dental procedure.  A biopsy.  A colonoscopy. During the procedure, you may receive a medicine to help you relax (sedative). There are three levels of sedation:  Mild sedation. At this level, you may feel awake and relaxed. You will be able to follow directions.  Moderate sedation. At this level, you will be sleepy. You may not remember the procedure.  Deep sedation. At this level, you will be asleep. You will not remember the procedure. The more medicine you are given, the deeper your level of sedation will be. Depending on how you respond to the procedure, the anesthesia specialist may change your level of sedation or the type of anesthesia to fit your needs. An anesthesia specialist  will monitor you closely during the procedure. Let your health care provider know about:  Any allergies you have.  All medicines you are taking, including vitamins, herbs, eye drops, creams, and over-the-counter medicines.  Any use of steroids (by mouth or as a cream).  Any problems you or family members have had with sedatives and anesthetic medicines.  Any blood disorders you have.  Any surgeries you have had.  Any medical conditions you have, such as sleep apnea.  Whether you are pregnant or may be pregnant.  Any use of cigarettes, alcohol, or street drugs. What are the risks? Generally, this is a safe procedure. However, problems may occur, including:  Getting too much medicine (oversedation).  Nausea.  Allergic reaction to medicines.  Trouble breathing. If this happens, a breathing tube may be used to help with breathing. It will be removed when you are awake and breathing on your own.  Heart trouble.  Lung trouble. Before the procedure Staying hydrated Follow instructions from your health care provider about hydration, which may include:  Up to 2 hours before the procedure - you may continue to drink clear liquids, such as water, clear fruit juice, black coffee, and plain tea. Eating and drinking restrictions Follow instructions from your health care provider about eating and drinking, which may include:  8 hours before the procedure - stop eating heavy meals or foods such as meat, fried foods, or fatty foods.  6 hours before the procedure - stop eating light meals or foods, such as toast or cereal.  6 hours before the procedure - stop drinking milk or drinks that contain milk.  2 hours before the procedure - stop drinking clear liquids. Medicines Ask your health care provider about:  Changing or stopping your regular medicines. This is especially important if you are taking diabetes medicines or blood thinners.  Taking medicines such as aspirin and  ibuprofen. These medicines can thin your blood. Do not take these medicines before your procedure if your health care provider instructs you not to. Tests and exams  You will have a physical exam.  You may have blood tests done to show: ? How well your kidneys and liver are working. ? How well your blood can clot. General instructions  Plan to have someone take you home from the hospital or clinic.  If you will be going home right after the procedure, plan to have someone with you for 24 hours.  What happens during the procedure?  Your blood pressure, heart rate, breathing, level of pain and overall condition will be monitored.  An IV tube will be inserted into one of your veins.  Your anesthesia specialist will give you medicines as needed to keep you comfortable during the procedure. This may mean changing the level of sedation.  The procedure will be performed. After the procedure  Your blood pressure, heart rate, breathing rate, and blood oxygen level will be monitored until the medicines you were given have worn off.  Do not drive for 24 hours if you received a sedative.  You may: ? Feel sleepy, clumsy, or nauseous. ? Feel forgetful about what happened after the procedure. ? Have a sore throat if you had a breathing tube during the procedure. ? Vomit. This information is not intended to replace advice given to you by your health care provider. Make sure you discuss any questions you have with your health care provider. Document Released: 12/10/2004 Document Revised: 08/23/2015 Document Reviewed: 07/07/2015 Elsevier Interactive Patient Education  2019 [place], Care After These instructions provide you with information about caring for yourself after your procedure. Your health care provider may also give you more specific instructions. Your treatment has been  planned according to current medical practices, but problems sometimes occur. Call  your health care provider if you have any problems or questions after your procedure. What can I expect after the procedure? After your procedure, you may:  Feel sleepy for several hours.  Feel clumsy and have poor balance for several hours.  Feel forgetful about what happened after the procedure.  Have poor judgment for several hours.  Feel nauseous or vomit.  Have a sore throat if you had a breathing tube during the procedure. Follow these instructions at home: For at least 24 hours after the procedure:      Have a responsible adult stay with you. It is important to have someone help care for you until you are awake and alert.  Rest as needed.  Do not: ? Participate in activities in which you could fall or become injured. ? Drive. ? Use heavy machinery. ? Drink alcohol. ? Take sleeping pills or medicines that cause drowsiness. ? Make important decisions or sign legal documents. ? Take care of children on your own. Eating and drinking  Follow the diet that is recommended by your health care provider.  If you vomit, drink water, juice, or soup when you can drink without vomiting.  Make sure you have little or no nausea before eating solid foods. General instructions  Take over-the-counter and prescription medicines only as told by your health care provider.  If you have sleep apnea, surgery and certain medicines can increase your risk for breathing problems. Follow instructions from your health care provider about wearing your sleep device: ? Anytime you are sleeping, including during daytime naps. ? While taking prescription pain medicines, sleeping medicines, or medicines that make you drowsy.  If you smoke, do not smoke without supervision.  Keep all follow-up visits as told by your health care provider. This is important. Contact a health care provider if:  You keep feeling nauseous or you keep vomiting.  You feel light-headed.  You develop a rash.  You  have a fever. Get help right away if:  You have trouble breathing. Summary  For several hours after your procedure, you may feel sleepy and have poor judgment.  Have a responsible adult stay with you for at least 24 hours or until you are awake and alert. This information is not intended to replace advice given to you by your health care provider. Make sure you discuss any questions you have with your health care provider. Document Released: 07/07/2015 Document Revised: 10/30/2016 Document Reviewed: 07/07/2015 Elsevier Interactive Patient Education  2019 [employer].

## 2018-04-05 ENCOUNTER — Encounter (HOSPITAL_COMMUNITY): Payer: Self-pay

## 2018-04-05 ENCOUNTER — Encounter (HOSPITAL_COMMUNITY)
Admission: RE | Admit: 2018-04-05 | Discharge: 2018-04-05 | Disposition: A | Discharge status: Home / Self Care | Payer: Medicare Other | Source: Ambulatory Visit | Attending: Gastroenterology | Admitting: Gastroenterology

## 2018-04-05 ENCOUNTER — Other Ambulatory Visit: Payer: Self-pay

## 2018-04-05 DIAGNOSIS — Z01818 Encounter for other preprocedural examination: Secondary | ICD-10-CM | POA: Insufficient documentation

## 2018-04-05 DIAGNOSIS — I10 Essential (primary) hypertension: Secondary | ICD-10-CM | POA: Insufficient documentation

## 2018-04-05 HISTORY — DX: Anxiety disorder, unspecified: F41.9

## 2018-04-05 HISTORY — DX: Unspecified osteoarthritis, unspecified site: M19.90

## 2018-04-05 LAB — CBC
HCT: 39.7 % (ref 36.0–46.0)
HEMOGLOBIN: 12.4 g/dL (ref 12.0–15.0)
MCH: 29.8 pg (ref 26.0–34.0)
MCHC: 31.2 g/dL (ref 30.0–36.0)
MCV: 95.4 fL (ref 80.0–100.0)
Platelets: 240 10*3/uL (ref 150–400)
RBC: 4.16 MIL/uL (ref 3.87–5.11)
RDW: 13.8 % (ref 11.5–15.5)
WBC: 3.9 10*3/uL — ABNORMAL LOW (ref 4.0–10.5)
nRBC: 0 % (ref 0.0–0.2)

## 2018-04-05 LAB — BASIC METABOLIC PANEL
Anion gap: 8 (ref 5–15)
BUN: 16 mg/dL (ref 8–23)
CO2: 24 mmol/L (ref 22–32)
Calcium: 9 mg/dL (ref 8.9–10.3)
Chloride: 107 mmol/L (ref 98–111)
Creatinine, Ser: 0.83 mg/dL (ref 0.44–1.00)
GFR calc Af Amer: >60 mL/min (ref >60)
GFR calc non Af Amer: >60 mL/min (ref >60)
Glucose, Bld: 95 mg/dL (ref 70–99)
Potassium: 4 mmol/L (ref 3.5–5.1)
SODIUM: 139 mmol/L (ref 135–145)

## 2018-04-07 ENCOUNTER — Emergency Department (HOSPITAL_COMMUNITY): Payer: Medicare Other

## 2018-04-07 ENCOUNTER — Encounter (HOSPITAL_COMMUNITY): Payer: Self-pay

## 2018-04-07 ENCOUNTER — Emergency Department (HOSPITAL_COMMUNITY)
Admission: EM | Admit: 2018-04-07 | Discharge: 2018-04-07 | Disposition: A | Discharge status: Home / Self Care | Payer: Medicare Other | Attending: Emergency Medicine | Admitting: Emergency Medicine

## 2018-04-07 ENCOUNTER — Other Ambulatory Visit: Payer: Self-pay

## 2018-04-07 DIAGNOSIS — I10 Essential (primary) hypertension: Secondary | ICD-10-CM | POA: Diagnosis not present

## 2018-04-07 DIAGNOSIS — R0602 Shortness of breath: Secondary | ICD-10-CM

## 2018-04-07 DIAGNOSIS — R062 Wheezing: Secondary | ICD-10-CM

## 2018-04-07 DIAGNOSIS — Z96643 Presence of artificial hip joint, bilateral: Secondary | ICD-10-CM | POA: Insufficient documentation

## 2018-04-07 DIAGNOSIS — E119 Type 2 diabetes mellitus without complications: Secondary | ICD-10-CM | POA: Insufficient documentation

## 2018-04-07 LAB — CBC WITH DIFFERENTIAL/PLATELET
Abs Immature Granulocytes: 0.02 10*3/uL (ref 0.00–0.07)
Basophils Absolute: 0 10*3/uL (ref 0.0–0.1)
Basophils Relative: 0 %
Eosinophils Absolute: 0 10*3/uL (ref 0.0–0.5)
Eosinophils Relative: 1 %
HCT: 39.4 % (ref 36.0–46.0)
Hemoglobin: 12.5 g/dL (ref 12.0–15.0)
Immature Granulocytes: 0 %
Lymphocytes Relative: 17 %
Lymphs Abs: 1.4 10*3/uL (ref 0.7–4.0)
MCH: 29.3 pg (ref 26.0–34.0)
MCHC: 31.7 g/dL (ref 30.0–36.0)
MCV: 92.5 fL (ref 80.0–100.0)
Monocytes Absolute: 0.4 10*3/uL (ref 0.1–1.0)
Monocytes Relative: 5 %
Neutro Abs: 6.4 10*3/uL (ref 1.7–7.7)
Neutrophils Relative %: 77 %
Platelets: 236 10*3/uL (ref 150–400)
RBC: 4.26 MIL/uL (ref 3.87–5.11)
RDW: 13.5 % (ref 11.5–15.5)
WBC: 8.2 10*3/uL (ref 4.0–10.5)
nRBC: 0 % (ref 0.0–0.2)

## 2018-04-07 LAB — BASIC METABOLIC PANEL
Anion gap: 12 (ref 5–15)
BUN: 10 mg/dL (ref 8–23)
CO2: 21 mmol/L — ABNORMAL LOW (ref 22–32)
Calcium: 9 mg/dL (ref 8.9–10.3)
Chloride: 102 mmol/L (ref 98–111)
Creatinine, Ser: 0.93 mg/dL (ref 0.44–1.00)
GFR calc Af Amer: >60 mL/min (ref >60)
GFR calc non Af Amer: >60 mL/min (ref >60)
Glucose, Bld: 150 mg/dL — ABNORMAL HIGH (ref 70–99)
POTASSIUM: 3.3 mmol/L — AB (ref 3.5–5.1)
Sodium: 135 mmol/L (ref 135–145)

## 2018-04-07 LAB — I-STAT TROPONIN, ED: Troponin i, poc: 0 ng/mL (ref 0.00–0.08)

## 2018-04-07 LAB — BRAIN NATRIURETIC PEPTIDE: B Natriuretic Peptide: 28 pg/mL (ref 0.0–100.0)

## 2018-04-07 LAB — INFLUENZA PANEL BY PCR (TYPE A & B)
Influenza A By PCR: NEGATIVE
Influenza B By PCR: NEGATIVE

## 2018-04-07 LAB — I-STAT CG4 LACTIC ACID, ED: Lactic Acid, Venous: 1.04 mmol/L (ref 0.5–1.9)

## 2018-04-07 MED ORDER — ALBUTEROL SULFATE HFA 108 (90 BASE) MCG/ACT IN AERS: 1.0000 | INHALATION_SPRAY | Freq: Four times a day (QID) | RESPIRATORY_TRACT | 0 refills | Status: AC | PRN

## 2018-04-07 MED ORDER — PREDNISONE 20 MG PO TABS: 40.0000 mg | ORAL_TABLET | Freq: Every day | ORAL | 0 refills | Status: AC

## 2018-04-07 MED ORDER — SODIUM CHLORIDE 0.9 % IV BOLUS
500.0000 mL | Freq: Once | INTRAVENOUS | Status: AC
  Administered 2018-04-07: 500 mL via INTRAVENOUS

## 2018-04-07 MED ORDER — AZITHROMYCIN 250 MG PO TABS: 250.0000 mg | ORAL_TABLET | Freq: Every day | ORAL | 0 refills | Status: AC

## 2018-04-07 MED ORDER — IPRATROPIUM-ALBUTEROL 0.5-2.5 (3) MG/3ML IN SOLN
3.0000 mL | Freq: Once | RESPIRATORY_TRACT | Status: AC
  Administered 2018-04-07: 3 mL via RESPIRATORY_TRACT
  Filled 2018-04-07: qty 3

## 2018-04-07 NOTE — ED Notes (Signed)
Respiratory notified of Duoneb. 

## 2018-04-07 NOTE — ED Triage Notes (Signed)
Pt sent from PCP in [place] for bilateral LL PNA diagnosed by Cxray at PCP ( [facility] [place]) office. Pt given Tylenol at 1800, 125 mg solumedrol IM, 1 gram rocephin IM, and duoneb x 2 at PCP office.

## 2018-04-07 NOTE — Discharge Instructions (Signed)

## 2018-04-07 NOTE — ED Provider Notes (Signed)
Emergency Department Provider Note   I have reviewed the triage vital signs and the nursing notes.   HISTORY  Chief Complaint Shortness of Breath (cough)   HPI Kathleen Bishop is a 68 y.o. female with PMH of chronic back pain, DM, HTN, and HLD presents to the emergency department for evaluation of shortness of breath, cough, subjective fevers, and wheezing.  The patient was sent by her PCP today.  The patient has had 3 weeks of symptoms with worsening symptoms in the past several days.  In the office, the patient had significant wheezing.  She was given 2 nebulizer treatments and intramuscular Solu-Medrol.  Her PCP, Dr. Alto DenverHunt, formed a chest x-ray which the patient was told showed bilateral lower lobe pneumonia.  She was given 1 g of Rocephin IM and arrived POV to the ED. patient denies any chest pain.  She does have a "pulling" discomfort in the center of her chest with coughing only.  She does feel somewhat short of breath.  She denies body aches.  No recent travel.  No sick contacts.  No recent hospitalization.  Past Medical History:  Diagnosis Date  . Anxiety   . Arthritis   . Chronic back pain    spondylolisthesis  . Diabetes mellitus without complication (HCC)    takes Metformin daily   . Eczema   . Headache    occasionally  . Hyperlipidemia    takes Simvastatin nightly  . Hypertension    takes Lisinopril daily  . Weakness    in hands and feet occasionally    Patient Active Problem List   Diagnosis Date Noted  . Taking multiple medications for chronic disease 01/19/2018  . Postoperative anemia due to acute blood loss 12/02/2015  . Fever, unspecified 12/02/2015  . Hypertension 12/02/2015  . Hyperlipidemia 12/02/2015  . Hematuria 12/02/2015  . Morbid obesity (HCC) 12/02/2015  . Controlled diabetes mellitus type 2 with complications (HCC)   . Hypokalemia   . Hypomagnesemia   . Acute kidney injury (HCC) 11/27/2015  . Acute post-operative pain 11/27/2015  .  Lumbar stenosis 11/26/2015    Past Surgical History:  Procedure Laterality Date  . ABDOMINAL HYSTERECTOMY     partial  . BACK SURGERY     lumbar fusion 2017  . JOINT REPLACEMENT Bilateral    hip  . SPINAL FUSION     Allergies Patient has no known allergies.  Family History  Problem Relation Age of Onset  . Colon cancer Neg Hx   . Gastric cancer Neg Hx   . Esophageal cancer Neg Hx     Social History Social History   Tobacco Use  . Smoking status: Never Smoker  . Smokeless tobacco: Never Used  Substance Use Topics  . Alcohol use: No  . Drug use: No    Review of Systems  Constitutional: Subjective fever and fatigue.  Eyes: No visual changes. ENT: No sore throat. Cardiovascular: Positive CP with coughing only.  Respiratory: Positive shortness of breath, cough, and wheezing.  Gastrointestinal: No abdominal pain.  No nausea, no vomiting.  No diarrhea.  No constipation. Genitourinary: Negative for dysuria. Musculoskeletal: Negative for back pain. Skin: Negative for rash. Neurological: Negative for headaches, focal weakness or numbness.  10-point ROS otherwise negative.  ____________________________________________   PHYSICAL EXAM:  VITAL SIGNS: ED Triage Vitals  Enc Vitals Group     BP 04/07/18 2007 127/82     Pulse Rate 04/07/18 2007 77     Resp 04/07/18 2007 18  Temp 04/07/18 2007 99.7 F (37.6 C)     Temp Source 04/07/18 2007 Tympanic     SpO2 04/07/18 2007 97 %     Weight 04/07/18 2011 236 lb (107 kg)     Height 04/07/18 2011 5\' 5"  (1.651 m)     Pain Score 04/07/18 2010 6   Constitutional: Alert and oriented. Well appearing but some increased WOB noted.  Eyes: Conjunctivae are normal.  Head: Atraumatic. Nose: No congestion/rhinnorhea. Mouth/Throat: Mucous membranes are moist.  Neck: No stridor.  Cardiovascular: Normal rate, regular rhythm. Good peripheral circulation. Grossly normal heart sounds.   Respiratory: Increased respiratory effort.   No retractions. Lungs with end-expiratory wheezing bilaterally.  Gastrointestinal: Soft and nontender. No distention.  Musculoskeletal: No lower extremity tenderness nor edema. No gross deformities of extremities. Neurologic:  Normal speech and language. No gross focal neurologic deficits are appreciated.  Skin:  Skin is warm, dry and intact. No rash noted.  ____________________________________________   LABS (all labs ordered are listed, but only abnormal results are displayed)  Labs Reviewed  BASIC METABOLIC PANEL - Abnormal; Notable for the following components:      Result Value   Potassium 3.3 (*)    CO2 21 (*)    Glucose, Bld 150 (*)    All other components within normal limits  BRAIN NATRIURETIC PEPTIDE  CBC WITH DIFFERENTIAL/PLATELET  INFLUENZA PANEL BY PCR (TYPE A & B)  I-STAT TROPONIN, ED  I-STAT CG4 LACTIC ACID, ED   ____________________________________________  RADIOLOGY  Dg Chest 2 View  Result Date: 04/07/2018 CLINICAL DATA:  Productive cough and shortness of breath for 3 weeks. History of diabetes and hypertension. EXAM: CHEST - 2 VIEW COMPARISON:  None. FINDINGS: Normal heart size and pulmonary vascularity. No focal airspace disease or consolidation in the lungs. No blunting of costophrenic angles. No pneumothorax. Mediastinal contours appear intact. Degenerative changes in the spine. IMPRESSION: No active cardiopulmonary disease. Electronically Signed   By: Burman NievesWilliam  Stevens M.D.   On: 04/07/2018 21:49    ____________________________________________   PROCEDURES  Procedure(s) performed:   Procedures  None ____________________________________________   INITIAL IMPRESSION / ASSESSMENT AND PLAN / ED COURSE  Pertinent labs & imaging results that were available during my care of the patient were reviewed by me and considered in my medical decision making (see chart for details).  Patient presents to the emergency department for evaluation of shortness of  breath with wheezing, coughing, subjective fevers.  Reportedly found to have bilateral pneumonia on chest x-ray and sent from PCP after receiving nebulizer, steroid, antibiotics.  Patient is not hypoxemic but does have some increased work of breathing with wheezing.  Plan for chest x-ray here, additional neb, labs, and reassess.  The patient does not have evidence of volume overload.  She does not smoke cigarettes.  Lower suspicion for atypical ACS presentation with a subjective fever symptoms. Plan to send flu as well.   Flu negative. No PNA on CXR. Patient with URI symptoms and wheezing. Extremely low suspicion for ACS or PE. No CP. No further evaluation here. Patient is feeling better after nebs. Continue steroid burst x 4 days, Azithromycin, and albuterol inh. Plan for PCP follow up and ED return if symptoms worsen.  ____________________________________________  FINAL CLINICAL IMPRESSION(S) / ED DIAGNOSES  Final diagnoses:  Shortness of breath  Wheezing     MEDICATIONS GIVEN DURING THIS VISIT:  Medications  sodium chloride 0.9 % bolus 500 mL (0 mLs Intravenous Stopped 04/07/18 2201)  ipratropium-albuterol (DUONEB) 0.5-2.5 (3)  MG/3ML nebulizer solution 3 mL (3 mLs Nebulization Given 04/07/18 2051)     NEW OUTPATIENT MEDICATIONS STARTED DURING THIS VISIT:  Discharge Medication List as of 04/07/2018 11:09 PM    START taking these medications   Details  albuterol (PROVENTIL HFA;VENTOLIN HFA) 108 (90 Base) MCG/ACT inhaler Inhale 1-2 puffs into the lungs every 6 (six) hours as needed for wheezing or shortness of breath., Starting Thu 04/07/2018, Print    azithromycin (ZITHROMAX) 250 MG tablet Take 1 tablet (250 mg total) by mouth daily. Take first 2 tablets together, then 1 every day until finished., Starting Thu 04/07/2018, Print    predniSONE (DELTASONE) 20 MG tablet Take 2 tablets (40 mg total) by mouth daily for 5 days., Starting Thu 04/07/2018, Until Tue 04/12/2018, Print        Note:  This  document was prepared using Dragon voice recognition software and may include unintentional dictation errors.  Alona Bene, MD Emergency Medicine    , Arlyss Repress, MD 04/08/18 [phone]

## 2018-04-12 ENCOUNTER — Ambulatory Visit (HOSPITAL_COMMUNITY)
Admission: RE | Admit: 2018-04-12 | Discharge: 2018-04-12 | Disposition: A | Discharge status: Home / Self Care | Payer: Medicare Other | Attending: Gastroenterology | Admitting: Gastroenterology

## 2018-04-12 ENCOUNTER — Encounter (HOSPITAL_COMMUNITY)
Admission: RE | Disposition: A | Discharge status: Home / Self Care | Payer: Self-pay | Source: Home / Self Care | Attending: Gastroenterology

## 2018-04-12 ENCOUNTER — Encounter (HOSPITAL_COMMUNITY): Payer: Self-pay | Admitting: *Deleted

## 2018-04-12 ENCOUNTER — Ambulatory Visit (HOSPITAL_COMMUNITY): Payer: Medicare Other | Admitting: Anesthesiology

## 2018-04-12 DIAGNOSIS — I10 Essential (primary) hypertension: Secondary | ICD-10-CM | POA: Insufficient documentation

## 2018-04-12 DIAGNOSIS — Z1211 Encounter for screening for malignant neoplasm of colon: Secondary | ICD-10-CM | POA: Diagnosis not present

## 2018-04-12 DIAGNOSIS — F419 Anxiety disorder, unspecified: Secondary | ICD-10-CM | POA: Diagnosis not present

## 2018-04-12 DIAGNOSIS — Z7984 Long term (current) use of oral hypoglycemic drugs: Secondary | ICD-10-CM | POA: Diagnosis not present

## 2018-04-12 DIAGNOSIS — Q439 Congenital malformation of intestine, unspecified: Secondary | ICD-10-CM | POA: Insufficient documentation

## 2018-04-12 DIAGNOSIS — K5730 Diverticulosis of large intestine without perforation or abscess without bleeding: Secondary | ICD-10-CM | POA: Insufficient documentation

## 2018-04-12 DIAGNOSIS — Z96643 Presence of artificial hip joint, bilateral: Secondary | ICD-10-CM | POA: Diagnosis not present

## 2018-04-12 DIAGNOSIS — K648 Other hemorrhoids: Secondary | ICD-10-CM | POA: Diagnosis not present

## 2018-04-12 DIAGNOSIS — Q438 Other specified congenital malformations of intestine: Secondary | ICD-10-CM | POA: Diagnosis not present

## 2018-04-12 DIAGNOSIS — E785 Hyperlipidemia, unspecified: Secondary | ICD-10-CM | POA: Diagnosis not present

## 2018-04-12 DIAGNOSIS — E119 Type 2 diabetes mellitus without complications: Secondary | ICD-10-CM | POA: Diagnosis not present

## 2018-04-12 DIAGNOSIS — Z79899 Other long term (current) drug therapy: Secondary | ICD-10-CM | POA: Insufficient documentation

## 2018-04-12 HISTORY — PX: COLONOSCOPY WITH PROPOFOL: SHX5780

## 2018-04-12 LAB — GLUCOSE, CAPILLARY: GLUCOSE-CAPILLARY: 108 mg/dL — AB (ref 70–99)

## 2018-04-12 SURGERY — COLONOSCOPY WITH PROPOFOL
Anesthesia: General

## 2018-04-12 MED ORDER — HYDROCODONE-ACETAMINOPHEN 7.5-325 MG PO TABS: 1.0000 | ORAL_TABLET | Freq: Once | ORAL | Status: DC | PRN

## 2018-04-12 MED ORDER — PROPOFOL 10 MG/ML IV BOLUS
INTRAVENOUS | Status: AC
  Filled 2018-04-12: qty 40

## 2018-04-12 MED ORDER — LACTATED RINGERS IV SOLN
INTRAVENOUS | Status: DC
  Administered 2018-04-12: 1000 mL via INTRAVENOUS

## 2018-04-12 MED ORDER — HYDROMORPHONE HCL 1 MG/ML IJ SOLN: 0.2500 mg | INTRAMUSCULAR | Status: DC | PRN

## 2018-04-12 MED ORDER — PROPOFOL 500 MG/50ML IV EMUL
INTRAVENOUS | Status: DC | PRN
  Administered 2018-04-12: 100 ug/kg/min via INTRAVENOUS
  Administered 2018-04-12: 150 ug/kg/min via INTRAVENOUS

## 2018-04-12 MED ORDER — MIDAZOLAM HCL 2 MG/2ML IJ SOLN: 0.5000 mg | Freq: Once | INTRAMUSCULAR | Status: DC | PRN

## 2018-04-12 MED ORDER — PROPOFOL 10 MG/ML IV BOLUS
INTRAVENOUS | Status: DC | PRN
  Administered 2018-04-12: 30 mg via INTRAVENOUS
  Administered 2018-04-12: 20 mg via INTRAVENOUS

## 2018-04-12 MED ORDER — CHLORHEXIDINE GLUCONATE CLOTH 2 % EX PADS: 6.0000 | MEDICATED_PAD | Freq: Once | CUTANEOUS | Status: DC

## 2018-04-12 MED ORDER — PROMETHAZINE HCL 25 MG/ML IJ SOLN: 6.2500 mg | INTRAMUSCULAR | Status: DC | PRN

## 2018-04-12 NOTE — Discharge Instructions (Signed)
You have small internal hemorrhoids and diverticulosis IN YOUR LEFT COLON. YOU DID NOT HAVE ANY POLYPS.    DRINK WATER TO KEEP YOUR URINE LIGHT YELLOW.  CONTINUE YOUR WEIGHT LOSS EFFORTS. YOUR BODY MASS INDEX IS OVER 30 WHICH MEANS YOU ARE OBESE. OBESITY IS ASSOCIATED WITH AN INCREASED FOR CIRRHOSIS AND ALL CANCERS, INCLUDING ESOPHAGEAL AND COLON CANCER. A WEIGHT OF 175 LBS OR LESS  WILL GET YOUR BODY MASS INDEX(BMI) UNDER 30. TO JUMP START YOUR WEIGHT LOSS, YOU SHOULDCONSIDER TRANSITIONING TO A PLANT BASED DIET-NO MEAT OR DAIRY.  I RECOMMEND THE BOOK, "PREVENT AND REVERSE HEART DISEASE". CALDWELL ESSELSTYN JR., MD. PAGE 120-121 CLEARLY STATE THE RULES AND QUICK AND EASY RECIPES FOR BREAKFAST, LUNCH, AND DINNER ARE AFTER P 127.    FOLLOW A HIGH FIBER DIET.  AVOID ITEMS THAT CAUSE BLOATING & GAS. SEE INFO BELOW.   Next colonoscopy in 10 years.   Colonoscopy Care After Read the instructions outlined below and refer to this sheet in the next week. These discharge instructions provide you with general information on caring for yourself after you leave the hospital. While your treatment has been planned according to the most current medical practices available, unavoidable complications occasionally occur. If you have any problems or questions after discharge, call DR. Mackinley Cassaday, [phone].  ACTIVITY  You may resume your regular activity, but move at a slower pace for the next 24 hours.   Take frequent rest periods for the next 24 hours.   Walking will help get rid of the air and reduce the bloated feeling in your belly (abdomen).   No driving for 24 hours (because of the medicine (anesthesia) used during the test).   You may shower.   Do not sign any important legal documents or operate any machinery for 24 hours (because of the anesthesia used during the test).    NUTRITION  Drink plenty of fluids.   You may resume your normal diet as instructed by your doctor.   Begin with a  light meal and progress to your normal diet. Heavy or fried foods are harder to digest and may make you feel sick to your stomach (nauseated).   Avoid alcoholic beverages for 24 hours or as instructed.    MEDICATIONS  You may resume your normal medications.   WHAT YOU CAN EXPECT TODAY  Some feelings of bloating in the abdomen.   Passage of more gas than usual.   Spotting of blood in your stool or on the toilet paper  .  IF YOU HAD POLYPS REMOVED DURING THE COLONOSCOPY:  Eat a soft diet IF YOU HAVE NAUSEA, BLOATING, ABDOMINAL PAIN, OR VOMITING.    FINDING OUT THE RESULTS OF YOUR TEST Not all test results are available during your visit. DR. Darrick Penna WILL CALL YOU WITHIN 7 DAYS OF YOUR PROCEDUE WITH YOUR RESULTS. Do not assume everything is normal if you have not heard from DR. Suheyla Mortellaro IN ONE WEEK, CALL HER OFFICE AT [phone].  SEEK IMMEDIATE MEDICAL ATTENTION AND CALL THE OFFICE: [phone] IF:  You have more than a spotting of blood in your stool.   Your belly is swollen (abdominal distention).   You are nauseated or vomiting.   You have a temperature over 101F.   You have abdominal pain or discomfort that is severe or gets worse throughout the day.  High-Fiber Diet A high-fiber diet changes your normal diet to include more whole grains, legumes, fruits, and vegetables. Changes in the diet involve replacing refined carbohydrates with  unrefined foods. The calorie level of the diet is essentially unchanged. The Dietary Reference Intake (recommended amount) for adult males is 38 grams per day. For adult females, it is 25 grams per day. Pregnant and lactating women should consume 28 grams of fiber per day. Fiber is the intact part of a plant that is not broken down during digestion. Functional fiber is fiber that has been isolated from the plant to provide a beneficial effect in the body.  PURPOSE  Increase stool bulk.   Ease and regulate bowel movements.   Lower  cholesterol.   REDUCE RISK OF COLON CANCER  INDICATIONS THAT YOU NEED MORE FIBER  Constipation and hemorrhoids.   Uncomplicated diverticulosis (intestine condition) and irritable bowel syndrome.   Weight management.   As a protective measure against hardening of the arteries (atherosclerosis), diabetes, and cancer.   GUIDELINES FOR INCREASING FIBER IN THE DIET  Start adding fiber to the diet slowly. A gradual increase of about 5 more grams (2 slices of whole-wheat bread, 2 servings of most fruits or vegetables, or 1 bowl of high-fiber cereal) per day is best. Too rapid an increase in fiber may result in constipation, flatulence, and bloating.   Drink enough water and fluids to keep your urine clear or pale yellow. Water, juice, or caffeine-free drinks are recommended. Not drinking enough fluid may cause constipation.   Eat a variety of high-fiber foods rather than one type of fiber.   Try to increase your intake of fiber through using high-fiber foods rather than fiber pills or supplements that contain small amounts of fiber.   The goal is to change the types of food eaten. Do not supplement your present diet with high-fiber foods, but replace foods in your present diet.   INCLUDE A VARIETY OF FIBER SOURCES  Replace refined and processed grains with whole grains, canned fruits with fresh fruits, and incorporate other fiber sources. White rice, white breads, and most bakery goods contain little or no fiber.   Brown whole-grain rice, buckwheat oats, and many fruits and vegetables are all good sources of fiber. These include: broccoli, Brussels sprouts, cabbage, cauliflower, beets, sweet potatoes, white potatoes (skin on), carrots, tomatoes, eggplant, squash, berries, fresh fruits, and dried fruits.   Cereals appear to be the richest source of fiber. Cereal fiber is found in whole grains and bran. Bran is the fiber-rich outer coat of cereal grain, which is largely removed in refining. In  whole-grain cereals, the bran remains. In breakfast cereals, the largest amount of fiber is found in those with "bran" in their names. The fiber content is sometimes indicated on the label.   You may need to include additional fruits and vegetables each day.   In baking, for 1 cup white flour, you may use the following substitutions:   1 cup whole-wheat flour minus 2 tablespoons.   1/2 cup white flour plus 1/2 cup whole-wheat flour.   Diverticulosis Diverticulosis is a common condition that develops when small pouches (diverticula) form in the wall of the colon. The risk of diverticulosis increases with age. It happens more often in people who eat a low-fiber diet. Most individuals with diverticulosis have no symptoms. Those individuals with symptoms usually experience belly (abdominal) pain, constipation, or loose stools (diarrhea).  HOME CARE INSTRUCTIONS  Increase the amount of fiber in your diet as directed by your caregiver or dietician. This may reduce symptoms of diverticulosis.   Drink at least 6 to 8 glasses of water each day to prevent  constipation.   Try not to strain when you have a bowel movement.   Avoiding nuts and seeds to prevent complications is NOT NECESSARY.   FOODS HAVING HIGH FIBER CONTENT INCLUDE:  Fruits. Apple, peach, pear, tangerine, raisins, prunes.   Vegetables. Brussels sprouts, asparagus, broccoli, cabbage, carrot, cauliflower, romaine lettuce, spinach, summer squash, tomato, winter squash, zucchini.   Starchy Vegetables. Baked beans, kidney beans, lima beans, split peas, lentils, potatoes (with skin).   Grains. Whole wheat bread, brown rice, bran flake cereal, plain oatmeal, white rice, shredded wheat, bran muffins.    SEEK IMMEDIATE MEDICAL CARE IF:  You develop increasing pain or severe bloating.   You have an oral temperature above 101F.   You develop vomiting or bowel movements that are bloody or black.

## 2018-04-12 NOTE — Op Note (Signed)
[facility] Patient Name: Kathleen PintoLillie Bishop Procedure Date: 04/12/2018 8:12 AM MRN: [id number] Date of Birth: 07/11/1950 Attending MD: Jonette EvaSandi Dream Nodal MD, MD CSN: [id number] Age: 68 Admit Type: Outpatient Procedure:                Colonoscopy, SCREENING Indications:              Screening for colorectal malignant neoplasm Providers:                Jonette EvaSandi Dailon Sheeran MD, MD, Loma MessingLurae B. Patsy LagerAlbert RN, RN, Edythe ClarityKelly                            Cox, Technician Referring MD:             Alvina Filbertenise Hunter Medicines:                Propofol per Anesthesia Complications:            No immediate complications. Estimated Blood Loss:     Estimated blood loss was minimal. Procedure:                Pre-Anesthesia Assessment:                           - Prior to the procedure, a History and Physical                            was performed, and patient medications and                            allergies were reviewed. The patient's tolerance of                            previous anesthesia was also reviewed. The risks                            and benefits of the procedure and the sedation                            options and risks were discussed with the patient.                            All questions were answered, and informed consent                            was obtained. Prior Anticoagulants: The patient has                            taken ibuprofen, last dose was 1 day prior to                            procedure. ASA Grade Assessment: II - A patient                            with mild systemic disease. After reviewing the  risks and benefits, the patient was deemed in                            satisfactory condition to undergo the procedure.                            After obtaining informed consent, the colonoscope                            was passed under direct vision. Throughout the                            procedure, the patient's blood pressure, pulse, and                        oxygen saturations were monitored continuously. The                            [id number]-[id number] ([id number]) scope was introduced through                            the anus and advanced to the the cecum, identified                            by appendiceal orifice and ileocecal valve. The                            colonoscopy was somewhat difficult due to a                            tortuous colon. Successful completion of the                            procedure was aided by straightening and shortening                            the scope to obtain bowel loop reduction and                            COLOWRAP. The patient tolerated the procedure well.                            The quality of the bowel preparation was good. The                            ileocecal valve, appendiceal orifice, and rectum                            were photographed. Scope In: 8:28:57 AM Scope Out: 8:41:15 AM Scope Withdrawal Time: 0 hours 8 minutes 59 seconds  Total Procedure Duration: 0 hours 12 minutes 18 seconds  Findings:      The recto-sigmoid colon, sigmoid colon and descending colon were       moderately tortuous.      Multiple small  and large-mouthed diverticula were found in the       recto-sigmoid colon and sigmoid colon.      Internal hemorrhoids were found. The hemorrhoids were small. Impression:               - Tortuous LEFT colon.                           - MODERATE Diverticulosis in the recto-sigmoid                            colon and in the sigmoid colon.                           - Internal hemorrhoids. Moderate Sedation:      Per Anesthesia Care Recommendation:           - Patient has a contact number available for                            emergencies. The signs and symptoms of potential                            delayed complications were discussed with the                            patient. Return to normal activities tomorrow.                             Written discharge instructions were provided to the                            patient.                           - High fiber diet.                           - Continue present medications.                           - Repeat colonoscopy in 10 years for surveillance. Procedure Code(s):        --- Professional ---                           [id number], Colonoscopy, flexible; diagnostic, including                            collection of specimen(s) by brushing or washing,                            when performed (separate procedure) Diagnosis Code(s):        --- Professional ---                           Z12.11, Encounter for screening for malignant  neoplasm of colon                           K64.8, Other hemorrhoids                           K57.30, Diverticulosis of large intestine without                            perforation or abscess without bleeding                           Q43.8, Other specified congenital malformations of                            intestine CPT copyright 2018 [employer]. All rights reserved. The codes documented in this report are preliminary and upon coder review may  be revised to meet current compliance requirements. Jonette Eva, MD Jonette Eva MD, MD 04/12/2018 8:55:53 AM This report has been signed electronically. Number of Addenda: 0

## 2018-04-12 NOTE — H&P (Signed)
Primary Care Physician:  Alvina Filbert, MD Primary Gastroenterologist:  Dr. Darrick Penna  Pre-Procedure History & Physical: HPI:  Kathleen Bishop is a 68 y.o. female here for COLON CANCER SCREENING.  Past Medical History:  Diagnosis Date  . Anxiety   . Arthritis   . Chronic back pain    spondylolisthesis  . Diabetes mellitus without complication (HCC)    takes Metformin daily   . Eczema   . Headache    occasionally  . Hyperlipidemia    takes Simvastatin nightly  . Hypertension    takes Lisinopril daily  . Weakness    in hands and feet occasionally    Past Surgical History:  Procedure Laterality Date  . ABDOMINAL HYSTERECTOMY     partial  . BACK SURGERY     lumbar fusion 2017  . JOINT REPLACEMENT Bilateral    hip  . SPINAL FUSION      Prior to Admission medications   Medication Sig Start Date End Date Taking? Authorizing Provider  albuterol (PROVENTIL HFA;VENTOLIN HFA) 108 (90 Base) MCG/ACT inhaler Inhale 1-2 puffs into the lungs every 6 (six) hours as needed for wheezing or shortness of breath. 04/07/18  Yes Long, Arlyss Repress, MD  azithromycin (ZITHROMAX) 250 MG tablet Take 1 tablet (250 mg total) by mouth daily. Take first 2 tablets together, then 1 every day until finished. 04/07/18  Yes Long, Arlyss Repress, MD  DULoxetine (CYMBALTA) 30 MG capsule Take 30 mg by mouth daily.   Yes [provider]  gabapentin (NEURONTIN) 300 MG capsule Take 300 mg by mouth daily.    Yes [provider]  ibuprofen (ADVIL,MOTRIN) 200 MG tablet Take 400 mg by mouth every 6 (six) hours as needed for headache or moderate pain.    Yes [provider]  lisinopril (PRINIVIL,ZESTRIL) 40 MG tablet Take 40 mg by mouth daily.   Yes [provider]  metFORMIN (GLUCOPHAGE) 500 MG tablet Take 500 mg by mouth 2 (two) times daily with a meal.   Yes [provider]  polyethylene glycol-electrolytes (TRILYTE) 420 g solution Take 4,000 mLs by mouth as directed. 02/02/18  Yes  Elfreda Blanchet L, MD  predniSONE (DELTASONE) 20 MG tablet Take 2 tablets (40 mg total) by mouth daily for 5 days. 04/07/18 04/12/18 Yes Long, Arlyss Repress, MD  simvastatin (ZOCOR) 40 MG tablet Take 40 mg by mouth at bedtime.   Yes [provider]    Allergies as of 01/19/2018 - Review Complete 01/19/2018  Allergen Reaction Noted  . No known allergies  11/25/2015    Family History  Problem Relation Age of Onset  . Colon cancer Neg Hx   . Gastric cancer Neg Hx   . Esophageal cancer Neg Hx     Social History   Socioeconomic History  . Marital status: Divorced    Spouse name: Not on file  . Number of children: Not on file  . Years of education: Not on file  . Highest education level: Not on file  Occupational History  . Not on file  Social Needs  . Financial resource strain: Not on file  . Food insecurity:    Worry: Not on file    Inability: Not on file  . Transportation needs:    Medical: Not on file    Non-medical: Not on file  Tobacco Use  . Smoking status: Never Smoker  . Smokeless tobacco: Never Used  Substance and Sexual Activity  . Alcohol use: No  . Drug use: No  .  Sexual activity: Not on file  Lifestyle  . Physical activity:    Days per week: Not on file    Minutes per session: Not on file  . Stress: Not on file  Relationships  . Social connections:    Talks on phone: Not on file    Gets together: Not on file    Attends religious service: Not on file    Active member of club or organization: Not on file    Attends meetings of clubs or organizations: Not on file    Relationship status: Not on file  . Intimate partner violence:    Fear of current or ex partner: Not on file    Emotionally abused: Not on file    Physically abused: Not on file    Forced sexual activity: Not on file  Other Topics Concern  . Not on file  Social History Narrative  . Not on file    Review of Systems: See HPI, otherwise negative ROS   Physical Exam: BP 139/83   Temp  98.6 F (37 C) (Oral)   Resp 18   SpO2 97%  General:   Alert,  pleasant and cooperative in NAD Head:  Normocephalic and atraumatic. Neck:  Supple; Lungs:  Clear throughout to auscultation.    Heart:  Regular rate and rhythm. Abdomen:  Soft, nontender and nondistended. Normal bowel sounds, without guarding, and without rebound.   Neurologic:  Alert and  oriented x4;  grossly normal neurologically.  Impression/Plan:    SCREENING  Plan:  1. TCS TODAY DISCUSSED PROCEDURE, BENEFITS, & RISKS: < 1% chance of medication reaction, bleeding, perforation, or rupture of spleen/liver.

## 2018-04-12 NOTE — Anesthesia Postprocedure Evaluation (Signed)
Anesthesia Post Note  Patient: Kathleen Bishop  Procedure(s) Performed: COLONOSCOPY WITH PROPOFOL (N/A )  Patient location during evaluation: PACU Anesthesia Type: General Level of consciousness: awake and alert and oriented Pain management: pain level controlled Vital Signs Assessment: post-procedure vital signs reviewed and stable Respiratory status: spontaneous breathing Cardiovascular status: blood pressure returned to baseline and stable Postop Assessment: no apparent nausea or vomiting Anesthetic complications: no     Last Vitals:  Vitals:   04/12/18 0716 04/12/18 0854  BP: 139/83 117/70  Pulse:  80  Resp: 18   Temp: 37 C 37.2 C  SpO2: 97% 95%    Last Pain:  Vitals:   04/12/18 0854  TempSrc:   PainSc: 0-No pain                 Joan Avetisyan

## 2018-04-12 NOTE — Transfer of Care (Signed)
Immediate Anesthesia Transfer of Care Note  Patient: Kathleen Bishop  Procedure(s) Performed: COLONOSCOPY WITH PROPOFOL (N/A )  Patient Location: PACU  Anesthesia Type:General  Level of Consciousness: awake, alert  and oriented  Airway & Oxygen Therapy: Patient Spontanous Breathing  Post-op Assessment: Report given to RN  Post vital signs: Reviewed and stable  Last Vitals:  Vitals Value Taken Time  BP    Temp    Pulse 39 04/12/2018  8:58 AM  Resp 19 04/12/2018  8:58 AM  SpO2 97 % 04/12/2018  8:58 AM  Vitals shown include unvalidated device data.  Last Pain:  Vitals:   04/12/18 0854  TempSrc:   PainSc: (P) 0-No pain      Patients Stated Pain Goal: 3 (04/12/18 0716)  Complications: No apparent anesthesia complications

## 2018-04-12 NOTE — Anesthesia Preprocedure Evaluation (Signed)
Anesthesia Evaluation  Patient identified by MRN, date of birth, ID band Patient awake    Reviewed: Allergy & Precautions, NPO status , Patient's Chart, lab work & pertinent test results  Airway Mallampati: II  TM Distance: >3 FB Neck ROM: Full    Dental no notable dental hx. (+) Teeth Intact   Pulmonary neg pulmonary ROS,    Pulmonary exam normal breath sounds clear to auscultation       Cardiovascular Exercise Tolerance: Poor hypertension, negative cardio ROS Normal cardiovascular examI Rhythm:Regular Rate:Normal  Limited ET from spinal surg   Neuro/Psych  Headaches, Anxiety negative psych ROS   GI/Hepatic negative GI ROS, Neg liver ROS,   Endo/Other  negative endocrine ROSdiabetes, Type 2, Oral Hypoglycemic AgentsGives h/o recent ? URI Sound clear  Denies Fever WTP  Renal/GU Renal disease  negative genitourinary   Musculoskeletal  (+) Arthritis ,   Abdominal   Peds negative pediatric ROS (+)  Hematology negative hematology ROS (+) anemia ,   Anesthesia Other Findings   Reproductive/Obstetrics negative OB ROS                             Anesthesia Physical Anesthesia Plan  ASA: II  Anesthesia Plan: General   Post-op Pain Management:    Induction: Intravenous  PONV Risk Score and Plan:   Airway Management Planned: Nasal Cannula and Simple Face Mask  Additional Equipment:   Intra-op Plan:   Post-operative Plan:   Informed Consent: I have reviewed the patients History and Physical, chart, labs and discussed the procedure including the risks, benefits and alternatives for the proposed anesthesia with the patient or authorized representative who has indicated his/her understanding and acceptance.   Dental advisory given  Plan Discussed with: CRNA  Anesthesia Plan Comments:         Anesthesia Quick Evaluation

## 2018-04-14 ENCOUNTER — Encounter (HOSPITAL_COMMUNITY): Payer: Self-pay | Admitting: Gastroenterology

## 2019-11-01 IMAGING — MG DIGITAL SCREENING BILATERAL MAMMOGRAM WITH TOMO AND CAD
6 of 12 series · 6 of 36 positions shown · non-contrast
Comparison: Previous exam(s).

ACR Breast Density Category a: The breast tissue is almost entirely
fatty.

CLINICAL DATA: Screening.

EXAM:
DIGITAL SCREENING BILATERAL MAMMOGRAM WITH TOMO AND CAD

[L MLO synth-2D]
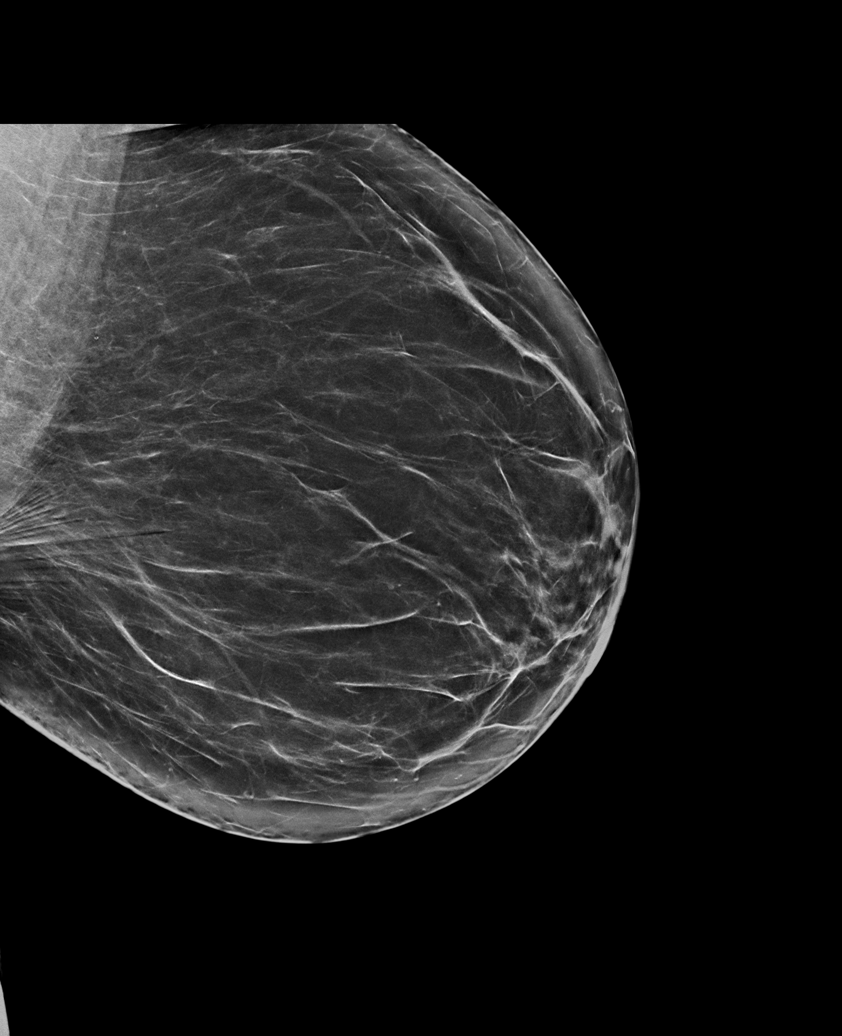

[R MLO synth-2D (1 of 2)]
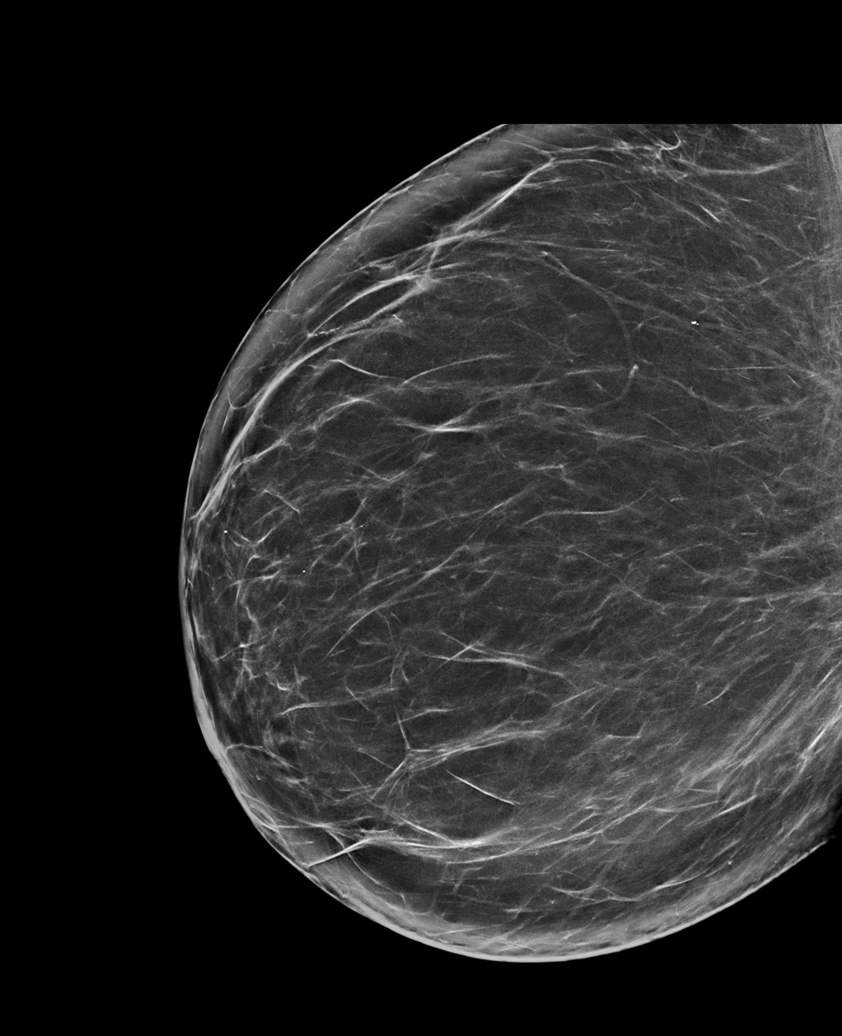

[R CC synth-2D (1 of 2)]
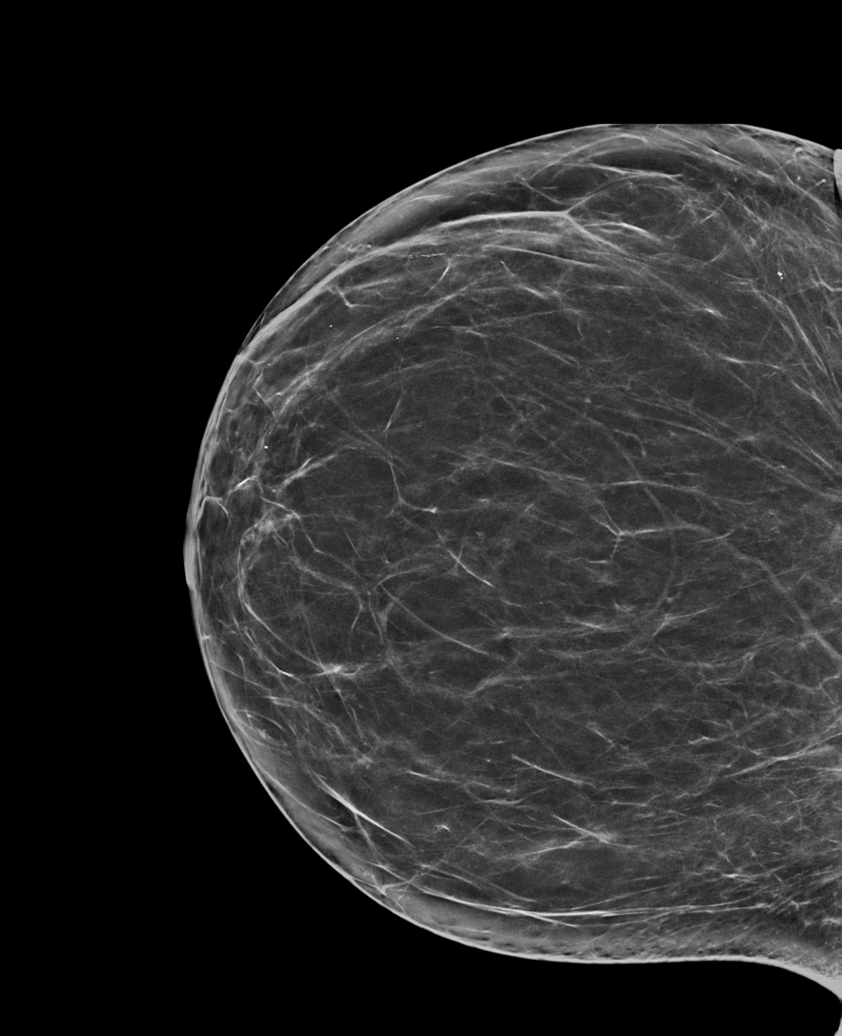

[L CC synth-2D]
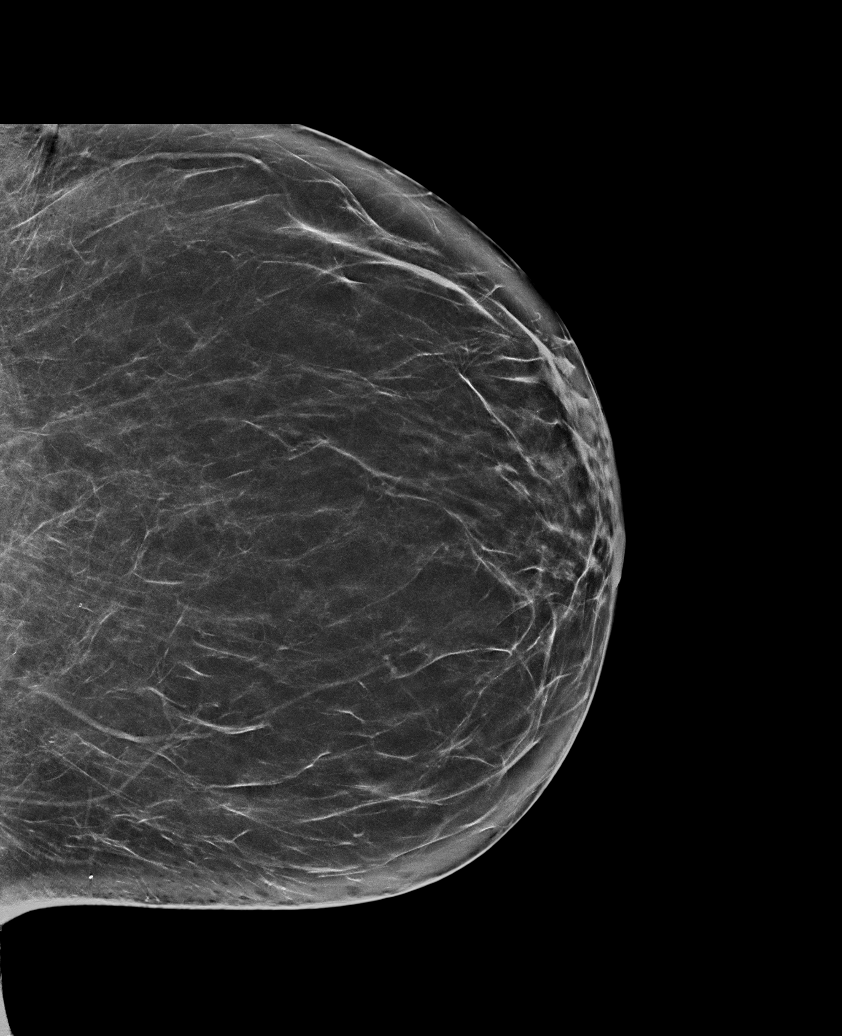

[R MLO synth-2D (2 of 2)]
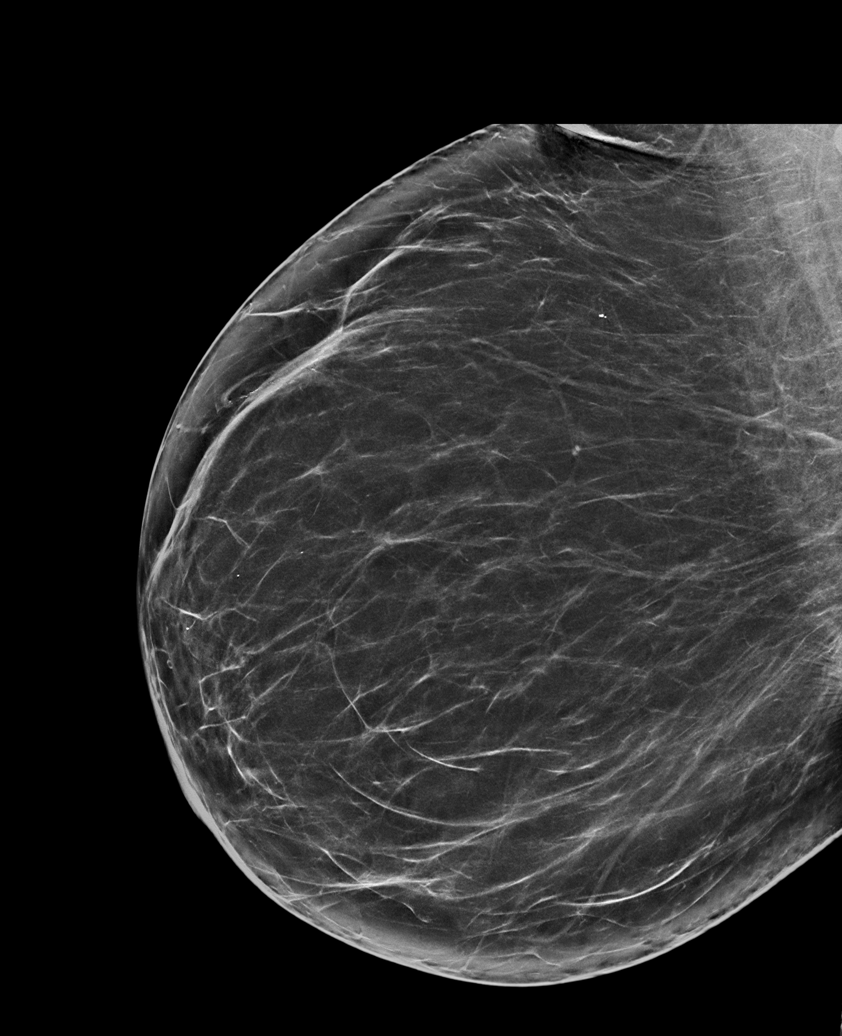

[R CC synth-2D (2 of 2)]
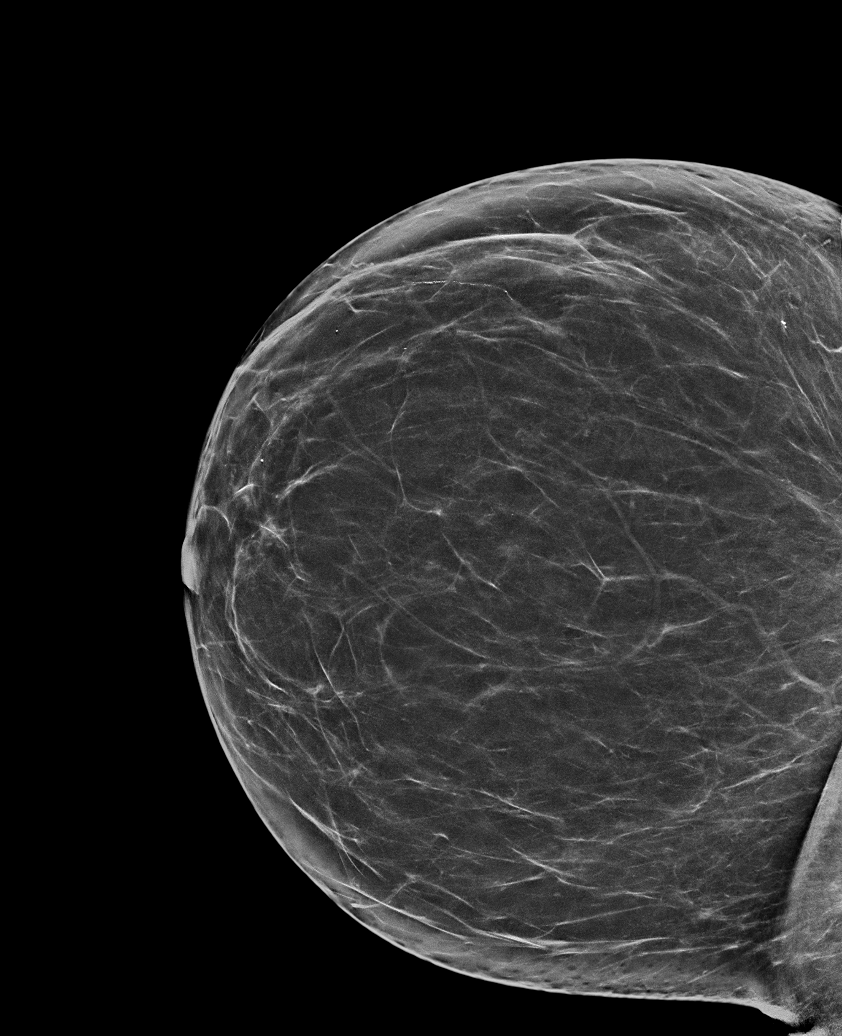

[6 of 36 positions shown; findings below may reference images not displayed]

FINDINGS: There are no findings suspicious for malignancy. Images were
processed with CAD.
IMPRESSION: No mammographic evidence of malignancy. A result letter of this
screening mammogram will be mailed directly to the patient.

RECOMMENDATION:
Screening mammogram in one year. (Code:[id number]-[id number])

BI-RADS CATEGORY  1: Negative.

## 2019-12-28 IMAGING — DX DG CHEST 2V
2 series · 2 of 2 positions shown · non-contrast
Comparison: None.

CLINICAL DATA: Productive cough and shortness of breath for 3
weeks. History of diabetes and hypertension.

EXAM:
CHEST - 2 VIEW

[chest pa]
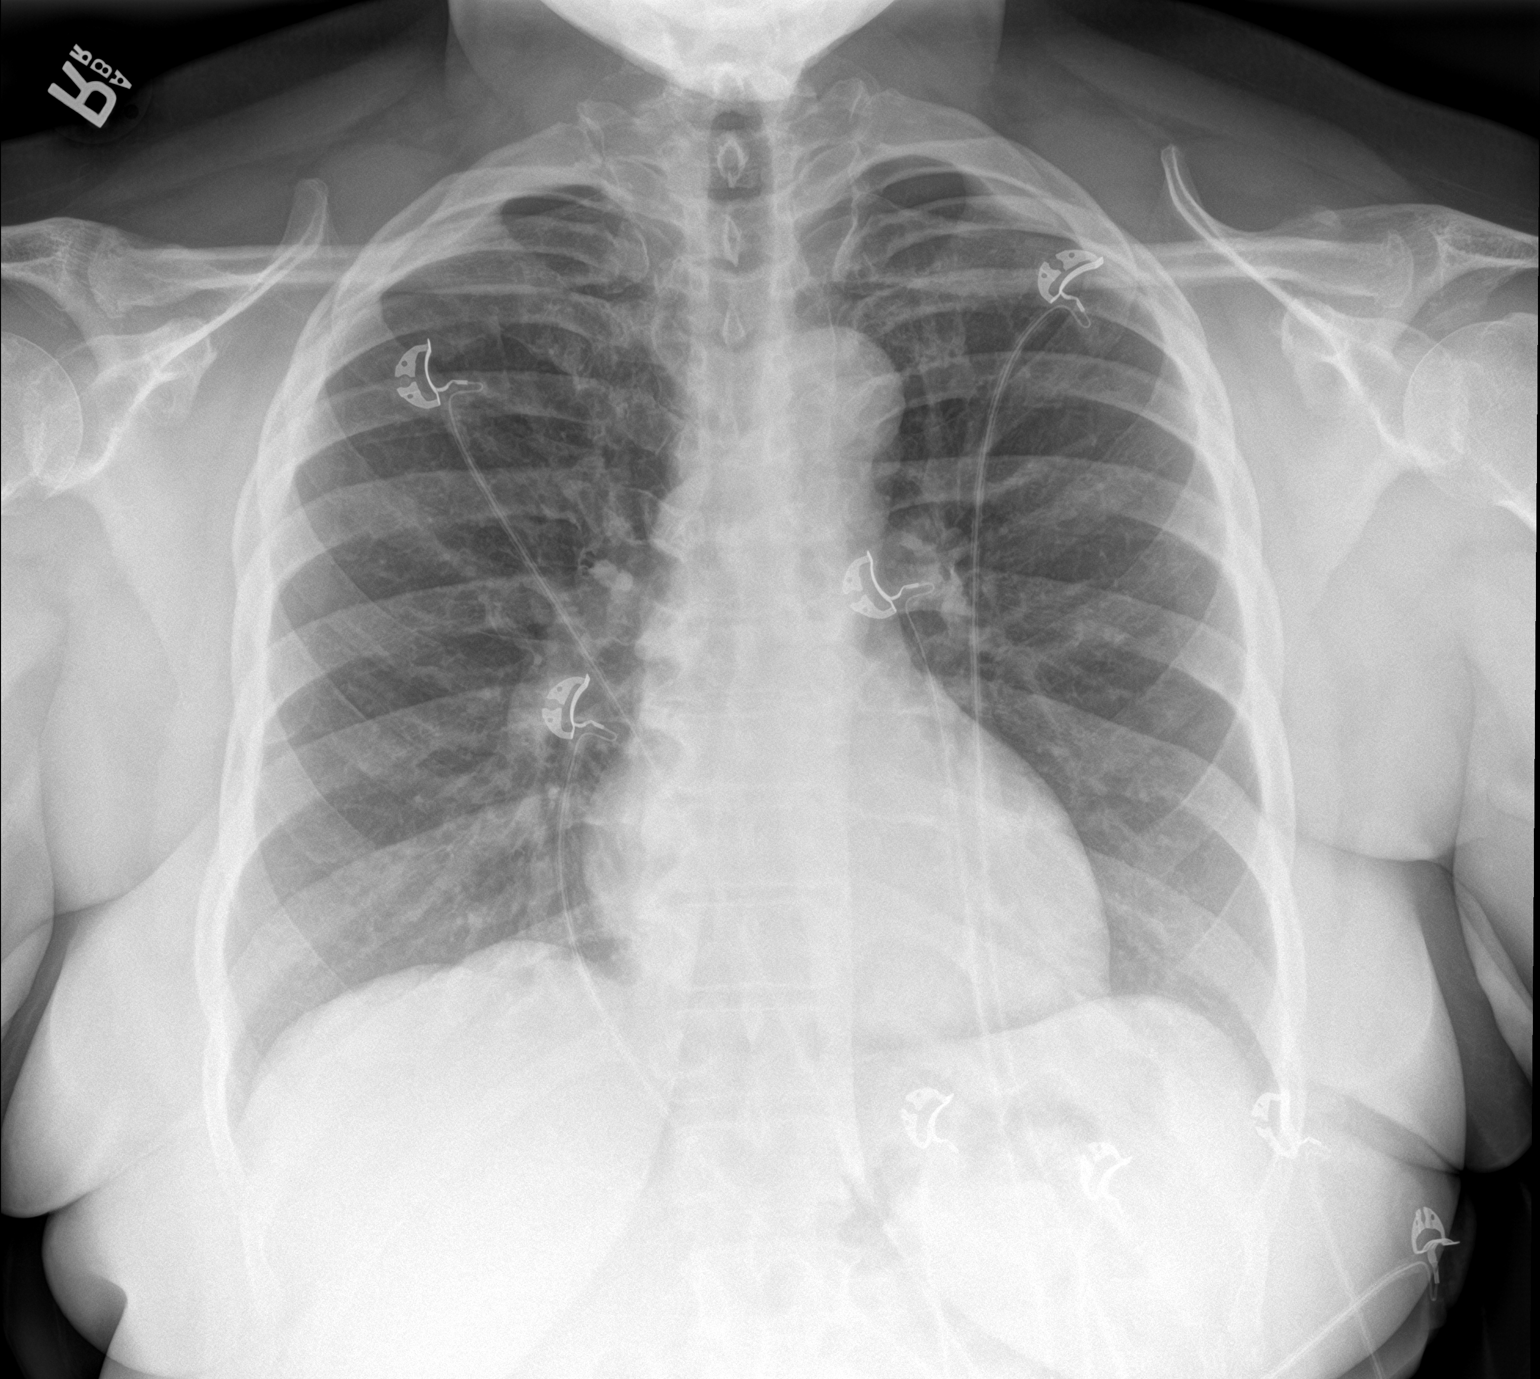

[chest lat]
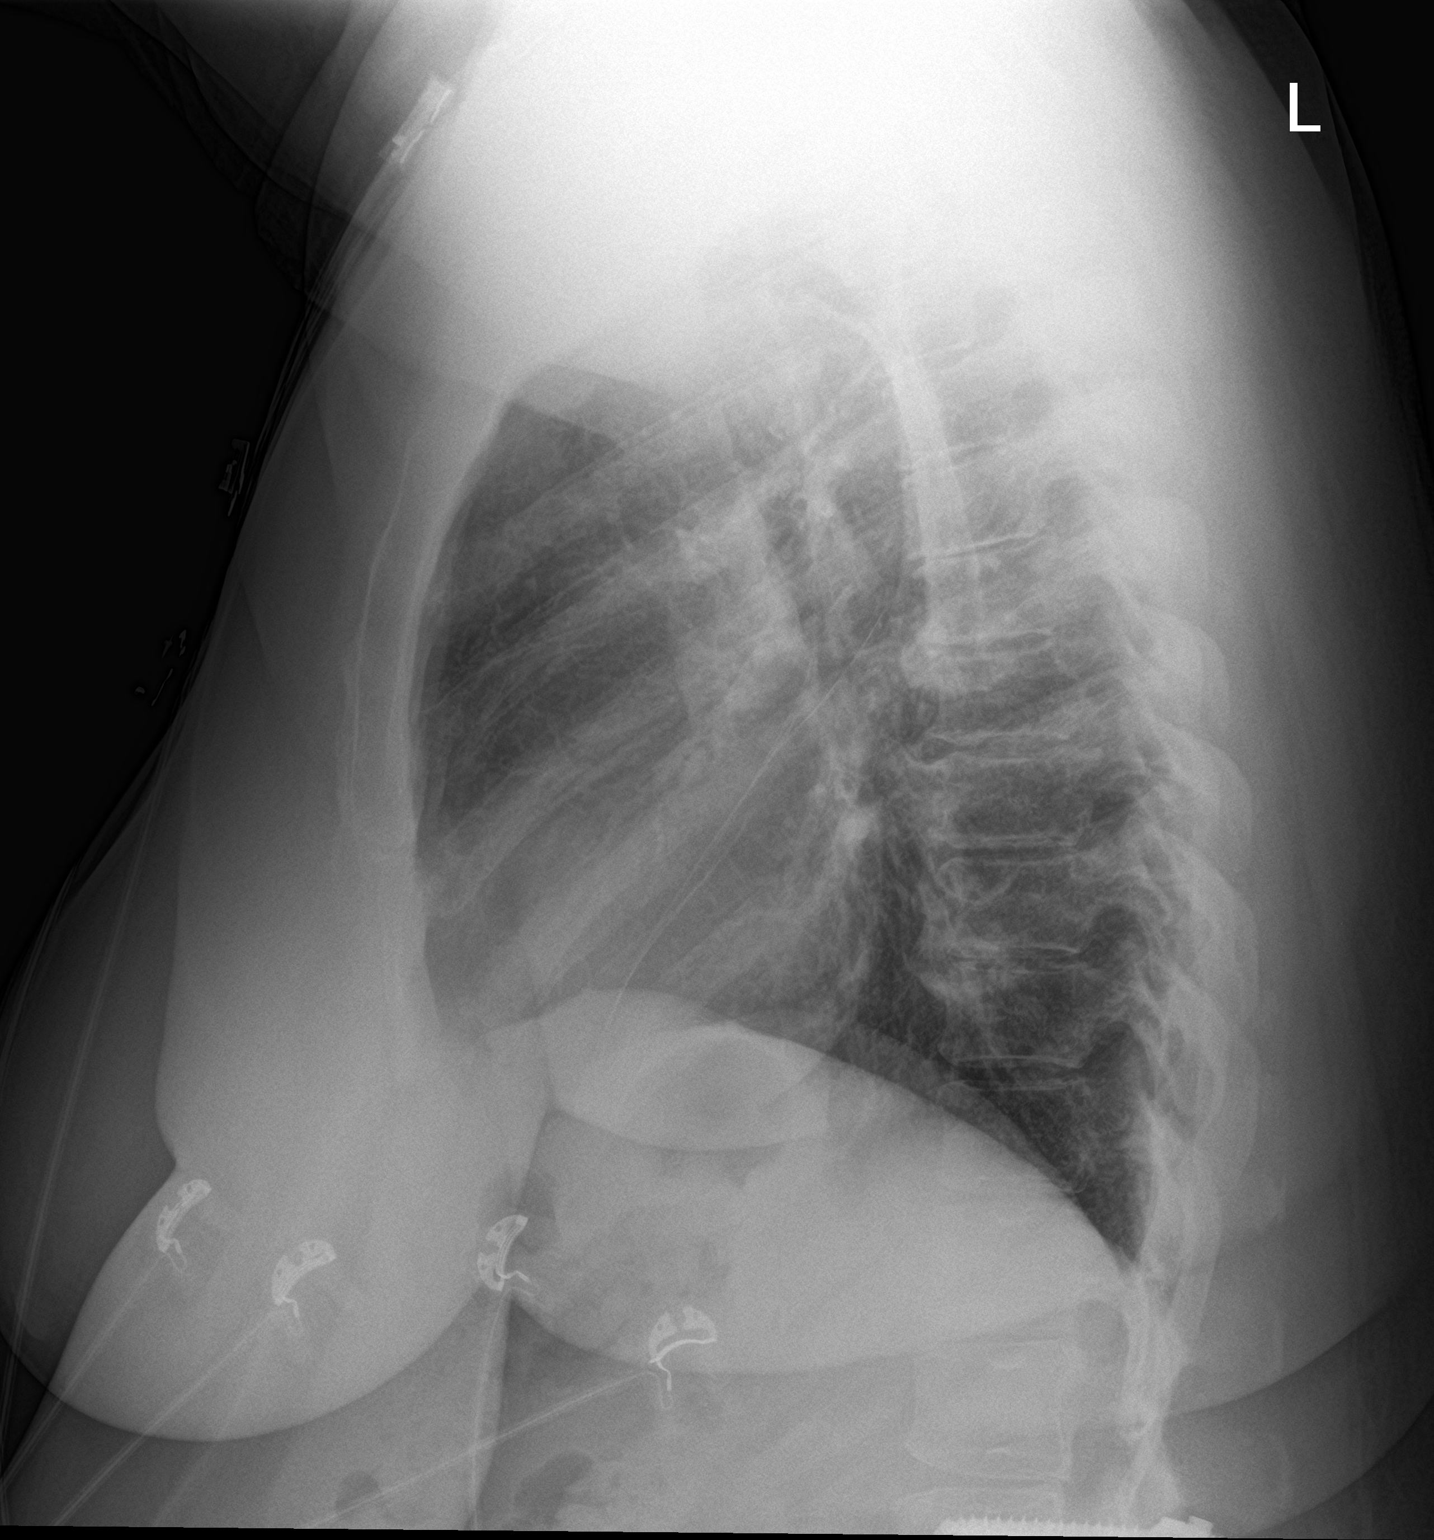

[2 of 2 positions shown; findings below may reference images not displayed]

FINDINGS: Normal heart size and pulmonary vascularity. No focal airspace
disease or consolidation in the lungs. No blunting of costophrenic
angles. No pneumothorax. Mediastinal contours appear intact.
Degenerative changes in the spine.
IMPRESSION: No active cardiopulmonary disease.
